# Patient Record
Sex: Male | Born: 1945 | Race: White | Hispanic: No | Smoking: Never smoker
Health system: Southern US, Community
[De-identification: ages and names within clinical notes are randomized; demographics above are authoritative.]

## PROBLEM LIST (undated history)

## (undated) DIAGNOSIS — E785 Hyperlipidemia, unspecified: Secondary | ICD-10-CM

## (undated) DIAGNOSIS — I213 ST elevation (STEMI) myocardial infarction of unspecified site: Secondary | ICD-10-CM

## (undated) DIAGNOSIS — Z9289 Personal history of other medical treatment: Secondary | ICD-10-CM

## (undated) DIAGNOSIS — I2510 Atherosclerotic heart disease of native coronary artery without angina pectoris: Secondary | ICD-10-CM

## (undated) DIAGNOSIS — N200 Calculus of kidney: Secondary | ICD-10-CM

## (undated) DIAGNOSIS — Z8249 Family history of ischemic heart disease and other diseases of the circulatory system: Secondary | ICD-10-CM

## (undated) DIAGNOSIS — E039 Hypothyroidism, unspecified: Secondary | ICD-10-CM

## (undated) HISTORY — DX: Hyperlipidemia, unspecified: E78.5

## (undated) HISTORY — DX: Personal history of other medical treatment: Z92.89

## (undated) HISTORY — PX: NASAL SEPTUM SURGERY: SHX37

## (undated) HISTORY — DX: ST elevation (STEMI) myocardial infarction of unspecified site: I21.3

## (undated) HISTORY — DX: Family history of ischemic heart disease and other diseases of the circulatory system: Z82.49

## (undated) HISTORY — DX: Atherosclerotic heart disease of native coronary artery without angina pectoris: I25.10

---

## 2005-01-23 ENCOUNTER — Ambulatory Visit (HOSPITAL_COMMUNITY): Admission: RE | Admit: 2005-01-23 | Discharge: 2005-01-23 | Payer: Self-pay | Admitting: Gastroenterology

## 2005-01-26 ENCOUNTER — Ambulatory Visit (HOSPITAL_COMMUNITY): Admission: RE | Admit: 2005-01-26 | Discharge: 2005-01-26 | Payer: Self-pay | Admitting: Gastroenterology

## 2005-03-20 ENCOUNTER — Encounter: Admission: RE | Admit: 2005-03-20 | Discharge: 2005-03-20 | Payer: Self-pay | Admitting: Emergency Medicine

## 2010-11-27 ENCOUNTER — Encounter: Payer: Self-pay | Admitting: Gastroenterology

## 2011-01-20 ENCOUNTER — Telehealth (INDEPENDENT_AMBULATORY_CARE_PROVIDER_SITE_OTHER): Payer: Self-pay | Admitting: *Deleted

## 2011-01-23 ENCOUNTER — Encounter: Payer: Self-pay | Admitting: Cardiology

## 2011-01-23 ENCOUNTER — Ambulatory Visit (HOSPITAL_COMMUNITY): Payer: BC Managed Care – PPO | Attending: Family Medicine

## 2011-01-23 DIAGNOSIS — R079 Chest pain, unspecified: Secondary | ICD-10-CM

## 2011-01-23 DIAGNOSIS — Z136 Encounter for screening for cardiovascular disorders: Secondary | ICD-10-CM | POA: Insufficient documentation

## 2011-01-24 NOTE — Progress Notes (Signed)
Summary: Nuclear Pre-Procedure  Phone Note Outgoing Call Call back at [facility] Phone [phone]   Call placed by: Stanton Kidney, EMT-P,  January 20, 2011 10:47 AM Call placed to: Patient Action Taken: Phone Call Completed Summary of Call: Reviewed information on Myoview Information Sheet (see scanned document for further details).  Spoke with the patient. Stanton Kidney, EMT-P  January 20, 2011 10:48 AM      Nuclear Med Background Indications for Stress Test: Evaluation for Ischemia   History: GXT  History Comments: '97 GXT: NL  Symptoms: Chest Pain    Nuclear Pre-Procedure Cardiac Risk Factors: Family History - CAD, Hypertension, Lipids

## 2011-01-27 ENCOUNTER — Telehealth: Payer: Self-pay | Admitting: Cardiology

## 2011-01-27 NOTE — Telephone Encounter (Signed)
Faxed Stress to Patty at [facility] ([phone]).

## 2011-01-31 ENCOUNTER — Telehealth: Payer: Self-pay | Admitting: Cardiology

## 2011-01-31 NOTE — Telephone Encounter (Signed)
Stress faxed to [facility] @ [facility] @ [phone] 01/31/11/KM

## 2011-02-02 NOTE — Assessment & Plan Note (Signed)
Summary: Cardiology Nuclear Testing  Nuclear Med Background Indications for Stress Test: Evaluation for Ischemia   History: GXT  History Comments: '97 GXT: NL  Symptoms: Chest Pain, Chest Tightness    Nuclear Pre-Procedure Cardiac Risk Factors: Family History - CAD, Hypertension, Lipids Caffeine/Decaff Intake: none NPO After: 9:00 AM Lungs: clear IV 0.9% NS with Angio Cath: 18g     IV Site: R Antecubital IV Started by: Stanton Kidney, EMT-P Chest Size (in) 42     Height (in): 69.5 Weight (lb): 194 BMI: 28.34  Nuclear Med Study 1 or 2 day study:  1 day     Stress Test Type:  Stress Reading MD:  Olga Millers, MD     Referring MD:  Ancil Boozer Resting Radionuclide:  Technetium 33m Tetrofosmin     Resting Radionuclide Dose:  11 mCi  Stress Radionuclide:  Technetium 35m Tetrofosmin     Stress Radionuclide Dose:  33 mCi   Stress Protocol Exercise Time (min):  9:01 min     Max HR:  137 bpm     Predicted Max HR:  157 bpm  Max Systolic BP: 211 mm Hg     Percent Max HR:  87.26 %     METS: 10.10 Rate Pressure Product:  24097    Stress Test Technologist:  Milana Na, EMT-P     Nuclear Technologist:  Domenic Polite, CNMT  Rest Procedure  Myocardial perfusion imaging was performed at rest 45 minutes following the intravenous administration of Technetium 84m Tetrofosmin.  Stress Procedure  The patient exercised for 9:01. The patient stopped due to fatigue and chest tightness.  There were non specific ST-T wave changes.  Technetium 42m Tetrofosmin was injected at peak exercise and myocardial perfusion imaging was performed after a brief delay.  QPS Raw Data Images:  Acquisition technically good; normal left ventricular size. Stress Images:  Normal homogeneous uptake in all areas of the myocardium. Rest Images:  Normal homogeneous uptake in all areas of the myocardium. Subtraction (SDS):  No evidence of ischemia. Transient Ischemic Dilatation:  1.02  (Normal <1.22)  Lung/Heart Ratio:  .24  (Normal <0.45)  Quantitative Gated Spect Images QGS EDV:  111 ml QGS ESV:  45 ml QGS EF:  60 % QGS cine images:  Normal wall motion.   Overall Impression  Exercise Capacity: Good exercise capacity. BP Response: Hypertensive blood pressure response. Clinical Symptoms: There is chest pain ECG Impression: No diagnostic ST segment change suggestive of ischemia. Overall Impression: There is no sign of scar or ischemia; note patient did have chest pain during exercise.

## 2011-02-20 ENCOUNTER — Telehealth: Payer: Self-pay | Admitting: Cardiology

## 2011-02-20 NOTE — Telephone Encounter (Signed)
Faxed Stress over to [facility] to [phone] 02/20/11/KM

## 2011-04-30 ENCOUNTER — Emergency Department (HOSPITAL_COMMUNITY): Payer: BC Managed Care – PPO

## 2011-04-30 ENCOUNTER — Inpatient Hospital Stay (HOSPITAL_COMMUNITY)
Admission: EM | Admit: 2011-04-30 | Discharge: 2011-05-04 | DRG: 808 | Disposition: A | Discharge status: Home / Self Care | Payer: BC Managed Care – PPO | Attending: Cardiovascular Disease | Admitting: Cardiovascular Disease

## 2011-04-30 DIAGNOSIS — Z7901 Long term (current) use of anticoagulants: Secondary | ICD-10-CM

## 2011-04-30 DIAGNOSIS — I10 Essential (primary) hypertension: Secondary | ICD-10-CM | POA: Diagnosis present

## 2011-04-30 DIAGNOSIS — Z7982 Long term (current) use of aspirin: Secondary | ICD-10-CM

## 2011-04-30 DIAGNOSIS — I2582 Chronic total occlusion of coronary artery: Secondary | ICD-10-CM | POA: Diagnosis present

## 2011-04-30 DIAGNOSIS — M549 Dorsalgia, unspecified: Secondary | ICD-10-CM | POA: Diagnosis present

## 2011-04-30 DIAGNOSIS — D696 Thrombocytopenia, unspecified: Secondary | ICD-10-CM | POA: Diagnosis present

## 2011-04-30 DIAGNOSIS — I2510 Atherosclerotic heart disease of native coronary artery without angina pectoris: Secondary | ICD-10-CM | POA: Diagnosis present

## 2011-04-30 DIAGNOSIS — I213 ST elevation (STEMI) myocardial infarction of unspecified site: Secondary | ICD-10-CM

## 2011-04-30 DIAGNOSIS — I2109 ST elevation (STEMI) myocardial infarction involving other coronary artery of anterior wall: Principal | ICD-10-CM | POA: Diagnosis present

## 2011-04-30 DIAGNOSIS — I2589 Other forms of chronic ischemic heart disease: Secondary | ICD-10-CM | POA: Diagnosis present

## 2011-04-30 DIAGNOSIS — E785 Hyperlipidemia, unspecified: Secondary | ICD-10-CM | POA: Diagnosis present

## 2011-04-30 DIAGNOSIS — E039 Hypothyroidism, unspecified: Secondary | ICD-10-CM | POA: Diagnosis present

## 2011-04-30 DIAGNOSIS — K219 Gastro-esophageal reflux disease without esophagitis: Secondary | ICD-10-CM | POA: Diagnosis present

## 2011-04-30 HISTORY — PX: CORONARY ANGIOPLASTY WITH STENT PLACEMENT: SHX49

## 2011-04-30 HISTORY — DX: ST elevation (STEMI) myocardial infarction of unspecified site: I21.3

## 2011-04-30 LAB — CK TOTAL AND CKMB (NOT AT ARMC): Relative Index: 1.5 (ref 0.0–2.5)

## 2011-04-30 LAB — COMPREHENSIVE METABOLIC PANEL
ALT: 19 U/L (ref 0–53)
Albumin: 3.8 g/dL (ref 3.5–5.2)
CO2: 19 mEq/L (ref 19–32)
Calcium: 9.6 mg/dL (ref 8.4–10.5)
Creatinine, Ser: 1.21 mg/dL (ref 0.50–1.35)
GFR calc Af Amer: >60 mL/min (ref >60)
Glucose, Bld: 159 mg/dL — ABNORMAL HIGH (ref 70–99)
Total Protein: 6.5 g/dL (ref 6.0–8.3)

## 2011-04-30 LAB — APTT: aPTT: 20 seconds — ABNORMAL LOW (ref 24–37)

## 2011-05-01 LAB — LIPID PANEL
Cholesterol: 147 mg/dL (ref 0–200)
LDL Cholesterol: 86 mg/dL (ref 0–99)

## 2011-05-01 LAB — CARDIAC PANEL(CRET KIN+CKTOT+MB+TROPI)
CK, MB: 70.6 ng/mL (ref 0.3–4.0)
Relative Index: 9.5 — ABNORMAL HIGH (ref 0.0–2.5)
Relative Index: 9 — ABNORMAL HIGH (ref 0.0–2.5)
Total CK: 788 U/L — ABNORMAL HIGH (ref 7–232)
Total CK: 785 U/L — ABNORMAL HIGH (ref 7–232)
Troponin I: 21.36 ng/mL (ref <0.30)

## 2011-05-01 LAB — CBC
HCT: 39.8 % (ref 39.0–52.0)
Hemoglobin: 14.1 g/dL (ref 13.0–17.0)
MCH: 31.6 pg (ref 26.0–34.0)
MCHC: 35.8 g/dL (ref 30.0–36.0)
MCV: 89.2 fL (ref 78.0–100.0)
MCV: 89.7 fL (ref 78.0–100.0)
Platelets: 135 10*3/uL — ABNORMAL LOW (ref 150–400)
Platelets: 145 10*3/uL — ABNORMAL LOW (ref 150–400)
RBC: 4.46 MIL/uL (ref 4.22–5.81)
RBC: 4.55 MIL/uL (ref 4.22–5.81)

## 2011-05-01 LAB — BASIC METABOLIC PANEL
BUN: 16 mg/dL (ref 6–23)
GFR calc non Af Amer: >60 mL/min (ref >60)
Glucose, Bld: 89 mg/dL (ref 70–99)

## 2011-05-01 LAB — POCT ACTIVATED CLOTTING TIME
Activated Clotting Time: 166 seconds
Activated Clotting Time: 424 seconds

## 2011-05-01 LAB — POCT I-STAT, CHEM 8
Calcium, Ion: 1.16 mmol/L (ref 1.12–1.32)
Glucose, Bld: 169 mg/dL — ABNORMAL HIGH (ref 70–99)
TCO2: 20 mmol/L (ref 0–100)

## 2011-05-02 LAB — BASIC METABOLIC PANEL
BUN: 14 mg/dL (ref 6–23)
CO2: 26 mEq/L (ref 19–32)
Chloride: 105 mEq/L (ref 96–112)
GFR calc non Af Amer: >60 mL/min (ref >60)
Potassium: 3.7 mEq/L (ref 3.5–5.1)
Sodium: 138 mEq/L (ref 135–145)

## 2011-05-02 LAB — CBC
Hemoglobin: 14 g/dL (ref 13.0–17.0)
MCH: 31.7 pg (ref 26.0–34.0)
MCHC: 35.5 g/dL (ref 30.0–36.0)
Platelets: 118 10*3/uL — ABNORMAL LOW (ref 150–400)
RDW: 13.4 % (ref 11.5–15.5)
WBC: 9.2 10*3/uL (ref 4.0–10.5)

## 2011-05-02 NOTE — Cardiovascular Report (Signed)
NAMEMCKENZIE, TORUNO NO.:  [id number]  MEDICAL RECORD NO.:  [id number]  LOCATION:  2911                         FACILITY:  [facility]  PHYSICIAN:  Nanetta Batty, M.D.   DATE OF BIRTH:  [birth year]  DATE OF PROCEDURE: DATE OF DISCHARGE:                           CARDIAC CATHETERIZATION   Devin Norris is a 65 year old divorced Caucasian male with no cardiac risk factors other than strong family history.  He has been having some atypical chest pain.  Recently had a negative stress test at [facility] approximately a month ago.  He was playing tennis today and developed crushing chest pain at approximately 2 o'clock.  EMS was called and EKG showed acute anterior wall ST-segment elevation myocardial infarction.  He was brought emergently to [facility] for cath and intervention.  DESCRIPTION OF PROCEDURE:  The patient was brought to the Second Floor [facility] Cardiac Cath Lab urgently.  He was treated with IV heparin, morphine, and aspirin.  His right groin was prepped and shaved in the usual sterile fashion.  Xylocaine 1% was used for local anesthesia.  A 6- French sheath was inserted to the right femoral artery using standard Seldinger technique.  A 6-French right and left Judkins diagnostic catheter as well as a 6-French pigtail catheter were used for selective coronary angiography and left ventriculography respectively which was done at the end of the case.  Visipaque dye was used for the entirety of the case.  Retrograde aortic, ventricular, and pullback pressures were recorded.  HEMODYNAMICS: 1. Aortic systolic pressure 137, diastolic pressure is 75. 2. Left ventricular systolic pressure 133, end-diastolic pressure 29.  SELECTIVE CORONARY ANGIOGRAPHY: 1. Left main normal. 2. LAD; LAD was a large vessel and was occluded proximally. 3. Left circumflex; this was nondominant and was free of significant     disease. 4. Right coronary artery; this was dominant  and free of significant     disease. 5. Left ventriculography; RAO left ventriculogram was performed using     25 mL of Visipaque dye at 12 mL per second.  The overall LVEF was     estimated at approximately 30% with anteroapical akinesia.  The patient received Effient 60 mg p.o., Angiomax bolus with an ACT of 424 with the total contrast for the patient at 195 mL.  Using a 6-French XB LAD 3.5 guide catheter along with an 0.014 x 190 Asahi soft wire and a 2.0 x 12 Emerge angioplasty balloon reperfusion was obtained.  The patient did have significant reperfusion arrhythmias and he was given 300 mg of amiodarone bolus in the cath lab.  This ultimately became quiescent.  Following this, a 3.5 x 18 Vision bare metal stent was then deployed at 14 atmospheres and postdilated with a 4.0 x 15 Hudson Quantum at 14 atmospheres (4.04 mm).  Resulting reduction of total occlusion to 0% residual with TIMI 3 flow.  The patient tolerated the procedure well. The guidewire and catheter were removed.  The sheath was sewn securely in place.  The patient left the lab in stable condition.  IMPRESSION:  Successful direct angioplasty in the setting of an anterior wall STEMI with __________ balloon time of 27 minutes.  The patient will  be treated with aspirin, Effient, beta-blocker, ACE inhibitor, statin drug.  His enzymes will be cycled.  He will be discharged home in 72 hours.  Left lab in stable condition.     Nanetta Batty, M.D.     Cordelia Pen  D:  04/30/2011  T:  05/01/2011  Job:  [id number]  cc:   Second Floor [facility] Cardiac Cath Lab [facility]  Electronically Signed by Nanetta Batty M.D. on 05/02/2011 09:25:05 AM

## 2011-05-03 LAB — CBC
HCT: 44.5 % (ref 39.0–52.0)
Hemoglobin: 16 g/dL (ref 13.0–17.0)
MCHC: 36 g/dL (ref 30.0–36.0)
MCV: 89.4 fL (ref 78.0–100.0)
RDW: 13.4 % (ref 11.5–15.5)

## 2011-05-03 LAB — BASIC METABOLIC PANEL
BUN: 16 mg/dL (ref 6–23)
Chloride: 101 mEq/L (ref 96–112)
Creatinine, Ser: 1.04 mg/dL (ref 0.50–1.35)
GFR calc Af Amer: >60 mL/min (ref >60)
GFR calc non Af Amer: >60 mL/min (ref >60)
Glucose, Bld: 84 mg/dL (ref 70–99)
Potassium: 4.6 mEq/L (ref 3.5–5.1)

## 2011-05-03 LAB — MAGNESIUM: Magnesium: 1.9 mg/dL (ref 1.5–2.5)

## 2011-05-03 LAB — PHOSPHORUS: Phosphorus: 3.1 mg/dL (ref 2.3–4.6)

## 2011-05-03 LAB — POTASSIUM: Potassium: 3.7 mEq/L (ref 3.5–5.1)

## 2011-05-04 LAB — BASIC METABOLIC PANEL
BUN: 15 mg/dL (ref 6–23)
CO2: 26 mEq/L (ref 19–32)
Calcium: 8.3 mg/dL — ABNORMAL LOW (ref 8.4–10.5)
Chloride: 103 mEq/L (ref 96–112)
Creatinine, Ser: 0.96 mg/dL (ref 0.50–1.35)

## 2011-05-04 LAB — CBC
HCT: 44.2 % (ref 39.0–52.0)
Hemoglobin: 15.9 g/dL (ref 13.0–17.0)
MCH: 31.9 pg (ref 26.0–34.0)
MCHC: 36 g/dL (ref 30.0–36.0)
RDW: 13.4 % (ref 11.5–15.5)

## 2011-05-31 NOTE — Discharge Summary (Signed)
NAMERANVIR, RENOVATO NO.:  [id number]  MEDICAL RECORD NO.:  [id number]  LOCATION:  2030                         FACILITY:  [facility]  PHYSICIAN:  Nanetta Batty, M.D.   DATE OF BIRTH:  09-01-1946  DATE OF ADMISSION:  04/30/2011 DATE OF DISCHARGE:  05/04/2011                              DISCHARGE SUMMARY   DISCHARGE DIAGNOSES: 1. ST-elevation myocardial infarction of the anterior wall with 100%     left anterior descending stenosis.     a.     Emergent cardiac catheterization with rescue percutaneous      transluminal coronary angioplasty and bare-metal, Multilink Vision      stent to the left anterior descending with excellent results. 2. Initial ischemic cardiomyopathy with ejection fraction 30%, but by     2-D echo, January 31, 2011, ejection fraction 40%, and by Definity     contrast echo on June 28, ejection fraction up to 50%.     a.     No clot visualized. 3. Hypertension, treated and controlled. 4. Dyslipidemia. 5. Back pain secondary to mechanical issues during hospitalization. 6. Hypothyroidism. 7. Thrombocytopenia.  DISCHARGE CONDITION:  Improved.  PROCEDURES: 1. Emergent cardiac catheterization, April 30, 2011, by Dr. Allyson Sabal. 2. PTCA and stent deployment with a Multilink Vision bare-metal stent     by Dr. Nanetta Batty to the LAD, reducing 100% stenosis to 0 and     EF at that time was 30%.  DISCHARGE MEDICATIONS:  See medication reconciliation sheet from [facility], though we did add Effient for his stent as well as aspirin, Altace, and Crestor.  DISCHARGE INSTRUCTIONS: 1. Return to work after you see Dr. Allyson Sabal, planned to be out for 2     weeks. 2. Increase activity slowly.  May shower.  No lifting for 2 weeks.  No     driving for 1 week. 3. Low-sodium, heart-healthy diet. 4. Wash cath site with soap and water.  Call if any bleeding,     swelling, or drainage. 5. Follow up with Dr. Allyson Sabal, May 19, 2011, at 10:00 a.m.  HOSPITAL COURSE:  A  65 year old white male with a recent stress test 2 months ago for chest pain which was normal, was diagnosed with gastroesophageal reflux disease.  The patient has a history of hypertension.  He was playing tennis on April 30, 2011, when he developed 10/10 chest pain and diaphoresis and EMS was called.  His EKG revealed ST elevation in leads V1, II, and III and reciprocal changes in V5, V3, II, III, and aVF.  He was taken emergently to the cath lab for ST- elevation MI and was found to have a 100% LAD stenosis.  No other coronary artery disease was noted.  He underwent PTCA and stent deployment as stated.  LV function at that time was 30%.  The patient was admitted to the ICU/CCU and was stable.  His troponin peaked at 22. He did develop some back pain due to external pads and wires on that were on his back which resolved before discharge.  The patient was stabilized, blood pressure remained stable, continued to be improved during hospitalization.  He ambulated with cardiac rehab.  The patient also  has a history of hypothyroidism.  On June 27, 2-D echo was done which revealed improvement of EF up to 40%, but there is a question of small thrombus in apical anterolateral wall.  He was started on Coumadin and Lovenox and scheduled for Definity contrast echo on June 28.  The Definity contrast echo revealed no clots, EF was up to 50%, and he was stable for discharge home.  PHYSICAL EXAMINATION:  VITAL SIGNS:  Blood pressure at discharge 128/78, pulse 66, respiratory rate is 18, temperature 97.6, and oxygen saturation 99% on room air. HEART:  Regular rate and rhythm.  S1 and S2. LUNGS:  Clear. ABDOMEN:  Soft and nontender.  Positive bowel sounds. EXTREMITIES:  No hematoma at site.  He had talked with cardiac rehab as well and had no questions at time of discharge.  LABORATORY DATA:  Hemoglobin 15.9, hematocrit 44.2, WBCs were 9.2, and platelets 126,000.  They did drop with a combination  of Lovenox added to his medical regimen, and they had been 125,000 on admission.  Sodium 138, potassium 4, chloride 103, CO2 of 26, glucose 88, BUN 15, and creatinine 0.96.  Total bili 0.6, alkaline phos 66, SGOT 33, SGPT 19, total protein 6.5, albumin 3.8, calcium 9.6, phosphorus 3.1, and magnesium 1.9.  Cardiac enzymes; CK ranged 161, 788, 785; MBs 2.4, 75.2, 70.6; and troponin less than 0.30, 22.16, and 21.36.  Cholesterol 147, triglycerides 80, HDL 45, and LDL 86.  MRSA screen was negative.  Chest x-ray on admission low-volume film with cardiomegaly and pulmonary vascular congestion without any overt pulmonary edema.  As stated, the patient ambulated with cardiac rehab prior to discharge without any complications.  He felt well at discharge and had no complaints.  He will follow up as instructed.     Darcella Gasman. Annie Paras, N.P.   ______________________________ Nanetta Batty, M.D.    LRI/MEDQ  D:  05/04/2011  T:  05/05/2011  Job:  [id number]  cc:   Ancil Boozer, MD  Electronically Signed by Nada Boozer N.P. on 05/11/2011 02:48:46 PM Electronically Signed by Nanetta Batty M.D. on 05/31/2011 03:17:33 PM

## 2011-06-19 ENCOUNTER — Encounter (HOSPITAL_COMMUNITY)
Admission: RE | Admit: 2011-06-19 | Discharge: 2011-06-19 | Disposition: A | Discharge status: Home / Self Care | Payer: BC Managed Care – PPO | Source: Ambulatory Visit | Attending: Cardiovascular Disease | Admitting: Cardiovascular Disease

## 2011-06-19 DIAGNOSIS — I2589 Other forms of chronic ischemic heart disease: Secondary | ICD-10-CM | POA: Insufficient documentation

## 2011-06-19 DIAGNOSIS — E785 Hyperlipidemia, unspecified: Secondary | ICD-10-CM | POA: Insufficient documentation

## 2011-06-19 DIAGNOSIS — Z9861 Coronary angioplasty status: Secondary | ICD-10-CM | POA: Insufficient documentation

## 2011-06-19 DIAGNOSIS — Z5189 Encounter for other specified aftercare: Secondary | ICD-10-CM | POA: Insufficient documentation

## 2011-06-19 DIAGNOSIS — I10 Essential (primary) hypertension: Secondary | ICD-10-CM | POA: Insufficient documentation

## 2011-06-19 DIAGNOSIS — I252 Old myocardial infarction: Secondary | ICD-10-CM | POA: Insufficient documentation

## 2011-06-19 DIAGNOSIS — D696 Thrombocytopenia, unspecified: Secondary | ICD-10-CM | POA: Insufficient documentation

## 2011-06-19 DIAGNOSIS — E039 Hypothyroidism, unspecified: Secondary | ICD-10-CM | POA: Insufficient documentation

## 2011-06-21 ENCOUNTER — Encounter (HOSPITAL_COMMUNITY): Payer: BC Managed Care – PPO

## 2011-06-23 ENCOUNTER — Encounter (HOSPITAL_COMMUNITY): Payer: BC Managed Care – PPO

## 2011-06-26 ENCOUNTER — Encounter (HOSPITAL_COMMUNITY): Payer: BC Managed Care – PPO

## 2011-06-28 ENCOUNTER — Encounter (HOSPITAL_COMMUNITY): Payer: BC Managed Care – PPO

## 2011-06-30 ENCOUNTER — Encounter (HOSPITAL_COMMUNITY): Payer: BC Managed Care – PPO

## 2011-07-03 ENCOUNTER — Encounter (HOSPITAL_COMMUNITY): Payer: BC Managed Care – PPO

## 2011-07-05 ENCOUNTER — Encounter (HOSPITAL_COMMUNITY): Payer: BC Managed Care – PPO

## 2011-07-07 ENCOUNTER — Encounter (HOSPITAL_COMMUNITY): Payer: BC Managed Care – PPO

## 2011-07-10 ENCOUNTER — Encounter (HOSPITAL_COMMUNITY): Payer: BC Managed Care – PPO

## 2011-07-12 ENCOUNTER — Encounter (HOSPITAL_COMMUNITY): Payer: BC Managed Care – PPO | Attending: Cardiovascular Disease

## 2011-07-12 DIAGNOSIS — E039 Hypothyroidism, unspecified: Secondary | ICD-10-CM | POA: Insufficient documentation

## 2011-07-12 DIAGNOSIS — Z9861 Coronary angioplasty status: Secondary | ICD-10-CM | POA: Insufficient documentation

## 2011-07-12 DIAGNOSIS — I2589 Other forms of chronic ischemic heart disease: Secondary | ICD-10-CM | POA: Insufficient documentation

## 2011-07-12 DIAGNOSIS — E785 Hyperlipidemia, unspecified: Secondary | ICD-10-CM | POA: Insufficient documentation

## 2011-07-12 DIAGNOSIS — I10 Essential (primary) hypertension: Secondary | ICD-10-CM | POA: Insufficient documentation

## 2011-07-12 DIAGNOSIS — Z5189 Encounter for other specified aftercare: Secondary | ICD-10-CM | POA: Insufficient documentation

## 2011-07-12 DIAGNOSIS — I252 Old myocardial infarction: Secondary | ICD-10-CM | POA: Insufficient documentation

## 2011-07-12 DIAGNOSIS — D696 Thrombocytopenia, unspecified: Secondary | ICD-10-CM | POA: Insufficient documentation

## 2011-07-14 ENCOUNTER — Encounter (HOSPITAL_COMMUNITY): Payer: BC Managed Care – PPO

## 2011-07-17 ENCOUNTER — Encounter (HOSPITAL_COMMUNITY): Payer: BC Managed Care – PPO

## 2011-07-19 ENCOUNTER — Encounter (HOSPITAL_COMMUNITY): Payer: BC Managed Care – PPO

## 2011-07-21 ENCOUNTER — Encounter (HOSPITAL_COMMUNITY): Payer: BC Managed Care – PPO

## 2011-07-24 ENCOUNTER — Encounter (HOSPITAL_COMMUNITY): Payer: BC Managed Care – PPO

## 2011-07-26 ENCOUNTER — Encounter (HOSPITAL_COMMUNITY): Payer: BC Managed Care – PPO

## 2011-07-28 ENCOUNTER — Encounter (HOSPITAL_COMMUNITY): Payer: BC Managed Care – PPO

## 2011-07-28 DIAGNOSIS — Z9289 Personal history of other medical treatment: Secondary | ICD-10-CM

## 2011-07-28 HISTORY — PX: TRANSTHORACIC ECHOCARDIOGRAM: SHX275

## 2011-07-28 HISTORY — DX: Personal history of other medical treatment: Z92.89

## 2011-07-31 ENCOUNTER — Encounter (HOSPITAL_COMMUNITY): Payer: BC Managed Care – PPO

## 2011-08-02 ENCOUNTER — Encounter (HOSPITAL_COMMUNITY): Payer: BC Managed Care – PPO

## 2011-08-04 ENCOUNTER — Encounter (HOSPITAL_COMMUNITY): Payer: BC Managed Care – PPO

## 2011-08-07 ENCOUNTER — Encounter (HOSPITAL_COMMUNITY): Payer: BC Managed Care – PPO

## 2011-08-09 ENCOUNTER — Encounter (HOSPITAL_COMMUNITY): Payer: BC Managed Care – PPO | Attending: Cardiovascular Disease

## 2011-08-09 DIAGNOSIS — Z9861 Coronary angioplasty status: Secondary | ICD-10-CM | POA: Insufficient documentation

## 2011-08-09 DIAGNOSIS — D696 Thrombocytopenia, unspecified: Secondary | ICD-10-CM | POA: Insufficient documentation

## 2011-08-09 DIAGNOSIS — Z5189 Encounter for other specified aftercare: Secondary | ICD-10-CM | POA: Insufficient documentation

## 2011-08-09 DIAGNOSIS — I252 Old myocardial infarction: Secondary | ICD-10-CM | POA: Insufficient documentation

## 2011-08-09 DIAGNOSIS — I2589 Other forms of chronic ischemic heart disease: Secondary | ICD-10-CM | POA: Insufficient documentation

## 2011-08-09 DIAGNOSIS — E039 Hypothyroidism, unspecified: Secondary | ICD-10-CM | POA: Insufficient documentation

## 2011-08-09 DIAGNOSIS — E785 Hyperlipidemia, unspecified: Secondary | ICD-10-CM | POA: Insufficient documentation

## 2011-08-09 DIAGNOSIS — I10 Essential (primary) hypertension: Secondary | ICD-10-CM | POA: Insufficient documentation

## 2011-08-11 ENCOUNTER — Encounter (HOSPITAL_COMMUNITY): Payer: BC Managed Care – PPO

## 2011-08-14 ENCOUNTER — Encounter (HOSPITAL_COMMUNITY): Payer: BC Managed Care – PPO

## 2011-08-16 ENCOUNTER — Encounter (HOSPITAL_COMMUNITY): Payer: BC Managed Care – PPO

## 2011-08-18 ENCOUNTER — Encounter (HOSPITAL_COMMUNITY): Payer: BC Managed Care – PPO

## 2011-08-21 ENCOUNTER — Encounter (HOSPITAL_COMMUNITY): Payer: BC Managed Care – PPO

## 2011-08-23 ENCOUNTER — Encounter (HOSPITAL_COMMUNITY): Payer: BC Managed Care – PPO

## 2011-08-25 ENCOUNTER — Encounter (HOSPITAL_COMMUNITY): Payer: BC Managed Care – PPO

## 2011-08-28 ENCOUNTER — Encounter (HOSPITAL_COMMUNITY): Payer: BC Managed Care – PPO

## 2011-08-30 ENCOUNTER — Other Ambulatory Visit: Payer: Self-pay | Admitting: Cardiovascular Disease

## 2011-08-30 ENCOUNTER — Encounter (HOSPITAL_COMMUNITY): Payer: BC Managed Care – PPO

## 2011-09-01 ENCOUNTER — Encounter (HOSPITAL_COMMUNITY): Payer: BC Managed Care – PPO

## 2011-09-04 ENCOUNTER — Encounter (HOSPITAL_COMMUNITY): Payer: BC Managed Care – PPO

## 2011-09-06 ENCOUNTER — Encounter (HOSPITAL_COMMUNITY): Payer: BC Managed Care – PPO

## 2011-09-08 ENCOUNTER — Encounter (HOSPITAL_COMMUNITY): Payer: BC Managed Care – PPO

## 2011-09-08 DIAGNOSIS — I2589 Other forms of chronic ischemic heart disease: Secondary | ICD-10-CM | POA: Insufficient documentation

## 2011-09-08 DIAGNOSIS — I10 Essential (primary) hypertension: Secondary | ICD-10-CM | POA: Insufficient documentation

## 2011-09-08 DIAGNOSIS — Z5189 Encounter for other specified aftercare: Secondary | ICD-10-CM | POA: Insufficient documentation

## 2011-09-08 DIAGNOSIS — E039 Hypothyroidism, unspecified: Secondary | ICD-10-CM | POA: Insufficient documentation

## 2011-09-08 DIAGNOSIS — D696 Thrombocytopenia, unspecified: Secondary | ICD-10-CM | POA: Insufficient documentation

## 2011-09-08 DIAGNOSIS — E785 Hyperlipidemia, unspecified: Secondary | ICD-10-CM | POA: Insufficient documentation

## 2011-09-08 DIAGNOSIS — Z9861 Coronary angioplasty status: Secondary | ICD-10-CM | POA: Insufficient documentation

## 2011-09-08 DIAGNOSIS — I252 Old myocardial infarction: Secondary | ICD-10-CM | POA: Insufficient documentation

## 2011-09-11 ENCOUNTER — Encounter (HOSPITAL_COMMUNITY): Payer: BC Managed Care – PPO

## 2011-09-13 ENCOUNTER — Encounter (HOSPITAL_COMMUNITY): Payer: BC Managed Care – PPO

## 2011-09-15 ENCOUNTER — Encounter (HOSPITAL_COMMUNITY): Payer: BC Managed Care – PPO

## 2011-09-15 NOTE — Progress Notes (Signed)
Devin Norris's blood pressure went up to 180/78 today at cardiac rehab today on the nustep.  Pt switched to the track bp then 172/80 exit bp 134/70. Devin Norris took his medications as prescribed.  Will fax bp's to Dr Hazle Coca office for review. Pt denied complaints at cardiac rehab.

## 2011-09-18 ENCOUNTER — Encounter (HOSPITAL_COMMUNITY): Payer: BC Managed Care – PPO

## 2011-09-20 ENCOUNTER — Encounter (HOSPITAL_COMMUNITY): Payer: BC Managed Care – PPO

## 2011-09-22 ENCOUNTER — Encounter (HOSPITAL_COMMUNITY)
Admission: RE | Admit: 2011-09-22 | Discharge: 2011-09-22 | Disposition: A | Discharge status: Home / Self Care | Payer: BC Managed Care – PPO | Source: Ambulatory Visit | Attending: Cardiovascular Disease | Admitting: Cardiovascular Disease

## 2011-09-25 ENCOUNTER — Encounter (HOSPITAL_COMMUNITY)
Admission: RE | Admit: 2011-09-25 | Discharge: 2011-09-25 | Disposition: A | Discharge status: Home / Self Care | Payer: BC Managed Care – PPO | Source: Ambulatory Visit | Attending: Cardiovascular Disease | Admitting: Cardiovascular Disease

## 2011-09-27 ENCOUNTER — Encounter (HOSPITAL_COMMUNITY)
Admission: RE | Admit: 2011-09-27 | Discharge: 2011-09-27 | Disposition: A | Discharge status: Home / Self Care | Payer: BC Managed Care – PPO | Source: Ambulatory Visit | Attending: Cardiovascular Disease | Admitting: Cardiovascular Disease

## 2011-10-02 ENCOUNTER — Encounter (HOSPITAL_COMMUNITY)
Admission: RE | Admit: 2011-10-02 | Discharge: 2011-10-02 | Disposition: A | Discharge status: Home / Self Care | Payer: BC Managed Care – PPO | Source: Ambulatory Visit | Attending: Cardiovascular Disease | Admitting: Cardiovascular Disease

## 2011-10-04 ENCOUNTER — Encounter (HOSPITAL_COMMUNITY): Payer: BC Managed Care – PPO

## 2011-10-06 ENCOUNTER — Encounter (HOSPITAL_COMMUNITY)
Admission: RE | Admit: 2011-10-06 | Discharge: 2011-10-06 | Disposition: A | Discharge status: Home / Self Care | Payer: BC Managed Care – PPO | Source: Ambulatory Visit | Attending: Cardiovascular Disease | Admitting: Cardiovascular Disease

## 2011-10-09 ENCOUNTER — Encounter (HOSPITAL_COMMUNITY)
Admission: RE | Admit: 2011-10-09 | Discharge: 2011-10-09 | Disposition: A | Discharge status: Home / Self Care | Payer: BC Managed Care – PPO | Source: Ambulatory Visit | Attending: Cardiovascular Disease | Admitting: Cardiovascular Disease

## 2011-10-09 DIAGNOSIS — I2589 Other forms of chronic ischemic heart disease: Secondary | ICD-10-CM | POA: Insufficient documentation

## 2011-10-09 DIAGNOSIS — Z9861 Coronary angioplasty status: Secondary | ICD-10-CM | POA: Insufficient documentation

## 2011-10-09 DIAGNOSIS — I10 Essential (primary) hypertension: Secondary | ICD-10-CM | POA: Insufficient documentation

## 2011-10-09 DIAGNOSIS — E785 Hyperlipidemia, unspecified: Secondary | ICD-10-CM | POA: Insufficient documentation

## 2011-10-09 DIAGNOSIS — D696 Thrombocytopenia, unspecified: Secondary | ICD-10-CM | POA: Insufficient documentation

## 2011-10-09 DIAGNOSIS — Z5189 Encounter for other specified aftercare: Secondary | ICD-10-CM | POA: Insufficient documentation

## 2011-10-09 DIAGNOSIS — I252 Old myocardial infarction: Secondary | ICD-10-CM | POA: Insufficient documentation

## 2011-10-09 DIAGNOSIS — E039 Hypothyroidism, unspecified: Secondary | ICD-10-CM | POA: Insufficient documentation

## 2011-10-11 ENCOUNTER — Encounter (HOSPITAL_COMMUNITY): Payer: BC Managed Care – PPO

## 2011-10-13 ENCOUNTER — Encounter (HOSPITAL_COMMUNITY): Payer: BC Managed Care – PPO

## 2011-10-13 NOTE — Progress Notes (Addendum)
Cardiac Rehabilitation Program Progress Report   Orientation:  06/15/2011 Graduate Date:  10/09/2011 Discharge Date:   # of sessions completed: 36/36  Cardiologist: Allyson Sabal Family MD:  Sandi Mealy Class Time:  945  A.  Exercise Program:  Tolerates exercise @ 5.8 METS for 30 minutes, Bike Test Results:  Pre: 1.27 miles and Post: 1.43 miles, Improved functional capacity  12.6 %, Improved  muscular strength  8.3 %, No Change  flexibility 12 inches %, Improved education score 4 %, Discharged to home exercise program.  Anticipated compliance:  good and Discharged  B.  Mental Health:  Good mental attitude and Quality of Life (QOL)  changes:  Overall  -6.4 %, Health/Functioning -2.5 %, Socioeconomics -7.8 %, Psych/Spiritual -1.2 %, Family -8.5 %    C.  Education/Instruction/Skills  Accurately checks own pulse.  Rest:  71  Exercise:  78-135, Knows THR for exercise, Uses Perceived Exertion Scale and/or Dyspnea Scale and Attended 11/13 education classes  Home exercise given: 06/23/2011  D.  Nutrition/Weight Control/Body Composition:  Adherence to prescribed nutrition program: good , % Body Fat  26.2 and Patient has lost 1.6 kg BMI 28.0  *This section completed by Mickle Plumb, Andres Shad, RD, LDN, CDE  E.  Blood Lipids    Lab Results  Component Value Date   CHOL 147 05/01/2011     Lab Results  Component Value Date   TRIG 80 05/01/2011     Lab Results  Component Value Date   HDL 45 05/01/2011     Lab Results  Component Value Date   CHOLHDL 3.3 05/01/2011     No results found for this basename: LDLDIRECT      F.  Lifestyle Changes:  Making positive lifestyle changes  G.  Symptoms noted with exercise:  Resting hypertension and Exertional hypertension  Report Completed By:  Hazle Nordmann   Comments:  Pt did well in program progressing from 3.4 METs to 5.8 METs.  Pt plans to continue to exercise by walking and playing tennis at home.  At discharge, pt  rhythm was sinus rhythm.       Thank you for the referral of fyour nice patient

## 2011-10-16 ENCOUNTER — Encounter (HOSPITAL_COMMUNITY): Payer: BC Managed Care – PPO

## 2011-10-18 ENCOUNTER — Encounter (HOSPITAL_COMMUNITY): Payer: BC Managed Care – PPO

## 2011-10-20 ENCOUNTER — Encounter (HOSPITAL_COMMUNITY): Payer: BC Managed Care – PPO

## 2013-05-02 ENCOUNTER — Other Ambulatory Visit: Payer: Self-pay | Admitting: *Deleted

## 2013-05-02 MED ORDER — ROSUVASTATIN CALCIUM 40 MG PO TABS: 40.0000 mg | ORAL_TABLET | Freq: Every day | ORAL | Status: DC

## 2013-06-25 ENCOUNTER — Telehealth: Payer: Self-pay | Admitting: Cardiovascular Disease

## 2013-06-25 NOTE — Telephone Encounter (Signed)
Need a lab order sent to him before his 75yr f/u appt on 08/04/2013.. Thanks

## 2013-06-25 NOTE — Telephone Encounter (Signed)
Lab slip mailed to pt. For cmp and lipids

## 2013-07-17 ENCOUNTER — Other Ambulatory Visit: Payer: Self-pay | Admitting: Cardiovascular Disease

## 2013-07-18 LAB — LIPID PANEL
Cholesterol: 131 mg/dL (ref 0–200)
HDL: 54 mg/dL (ref >39)
Total CHOL/HDL Ratio: 2.4 Ratio
Triglycerides: 62 mg/dL (ref <150)

## 2013-07-18 LAB — COMPREHENSIVE METABOLIC PANEL
BUN: 15 mg/dL (ref 6–23)
CO2: 29 mEq/L (ref 19–32)
Calcium: 9.3 mg/dL (ref 8.4–10.5)
Chloride: 105 mEq/L (ref 96–112)
Creat: 1.17 mg/dL (ref 0.50–1.35)
Glucose, Bld: 88 mg/dL (ref 70–99)
Total Bilirubin: 0.6 mg/dL (ref 0.3–1.2)

## 2013-07-28 ENCOUNTER — Encounter: Payer: Self-pay | Admitting: *Deleted

## 2013-08-01 ENCOUNTER — Encounter: Payer: Self-pay | Admitting: Cardiovascular Disease

## 2013-08-04 ENCOUNTER — Ambulatory Visit (INDEPENDENT_AMBULATORY_CARE_PROVIDER_SITE_OTHER): Payer: BC Managed Care – PPO | Admitting: Cardiovascular Disease

## 2013-08-04 ENCOUNTER — Encounter: Payer: Self-pay | Admitting: Cardiovascular Disease

## 2013-08-04 VITALS — BP 150/80 | HR 79 | Ht 69.0 in | Wt 193.0 lb

## 2013-08-04 DIAGNOSIS — E785 Hyperlipidemia, unspecified: Secondary | ICD-10-CM

## 2013-08-04 DIAGNOSIS — Z8249 Family history of ischemic heart disease and other diseases of the circulatory system: Secondary | ICD-10-CM

## 2013-08-04 DIAGNOSIS — I2510 Atherosclerotic heart disease of native coronary artery without angina pectoris: Secondary | ICD-10-CM

## 2013-08-04 NOTE — Assessment & Plan Note (Signed)
Status post STEMI 04/26/11 secondary to an occluded proximal LAD which I stented using a 3.5 mm x 18 mm long MultiLink vision bare-metal stent. His EF at that time was 30% with anteroapical akinesia. His peak CPK MB was 78 with a troponin of 21. The remainder of his epicardial arteries were normal. A followup 2-D echocardiogram performed 3 months later showed normal LV function and Myoview stress test showed complete salvage myocardium without evidence of scar. He's had no further chest pain.

## 2013-08-04 NOTE — Assessment & Plan Note (Signed)
On Crestor every other day. His primary care physician follows lipid profile.

## 2013-08-04 NOTE — Patient Instructions (Addendum)
Your physician wants you to follow-up in: 1 year with Dr Berry. You will receive a reminder letter in the mail two months in advance. If you don't receive a letter, please call our office to schedule the follow-up appointment.  

## 2013-08-04 NOTE — Progress Notes (Signed)
08/04/2013 Devin Norris   Feb 16, 1946  [id number]  Primary Physician Pcp Not In System Primary Cardiologist: Runell Gess MD Roseanne Reno   HPI:  The patient is a very pleasant [age]-year-old mildly overweight divorced Caucasian male, father of 2, grandfather to 2 grandchildren, who I last saw in the office 6 months ago. He works as an Surveyor, mining. His risk factors include a strong family history for heart disease. He suffered an anterior STEMI, April 30, 2011, at 4 o'clock in the afternoon, with an occluded proximal LAD, which I stented using a 3.5 x 18 mm long Multi-Link Vision bare-metal stent. His EF was 30% at that time with anteroapical akinesia. Peak CPK MB was 70 with a troponin of 21. His other epicardial arteries were normal. A followup echocardiogram and Myoview performed several months later revealed normal LV systolic function with complete salvage of myocardium and no evidence of scar. He really denies recurrent chest pain. His most recent lab work in September of this year revealed A. Total cholesterol 131, LDL of 66 and HDL of 34.    Current Outpatient Prescriptions  Medication Sig Dispense Refill  . aspirin 81 MG tablet Take 81 mg by mouth daily.      [name] atenolol (TENORMIN) 50 MG tablet Take 50 mg by mouth daily.      . clonazePAM (KLONOPIN) 0.5 MG tablet Take 0.5 mg by mouth 2 (two) times daily as needed for anxiety.      . fluticasone (FLONASE) 50 MCG/ACT nasal spray Place 2 sprays into the nose daily as needed for rhinitis.      [name] levothyroxine (SYNTHROID, LEVOTHROID) 50 MCG tablet Take 50 mcg by mouth daily before breakfast.      . losartan (COZAAR) 50 MG tablet Take 50 mg by mouth daily.      . Multiple Vitamins-Minerals (ICAPS PO) Take 2 capsules by mouth daily.      . Omega-3 Fatty Acids (FISH OIL PO) Take by mouth daily.      . rosuvastatin (CRESTOR) 40 MG tablet Take 1 tablet (40 mg total) by mouth daily.  30 tablet  6   No current  facility-administered medications for this visit.    Allergies  Allergen Reactions  . Contrast Media [Iodinated Diagnostic Agents]   . Ramipril Cough    History   Social History  . Marital Status: Divorced    Spouse Name: N/A    Number of Children: 2  . Years of Education: N/A   Occupational History  . Surveyor, mining    Social History Main Topics  . Smoking status: Never Smoker   . Smokeless tobacco: Not on file  . Alcohol Use: Not on file  . Drug Use: Not on file  . Sexual Activity: Not on file   Other Topics Concern  . Not on file   Social History Narrative  . No narrative on file     Review of Systems: General: negative for chills, fever, night sweats or weight changes.  Cardiovascular: negative for chest pain, dyspnea on exertion, edema, orthopnea, palpitations, paroxysmal nocturnal dyspnea or shortness of breath Dermatological: negative for rash Respiratory: negative for cough or wheezing Urologic: negative for hematuria Abdominal: negative for nausea, vomiting, diarrhea, bright red blood per rectum, melena, or hematemesis Neurologic: negative for visual changes, syncope, or dizziness All other systems reviewed and are otherwise negative except as noted above.    Blood pressure 150/80, pulse 79, height 5\' 9"  (1.753 m), weight 193 lb (87.544  kg).  General appearance: alert and no distress Neck: no adenopathy, no carotid bruit, no JVD, supple, symmetrical, trachea midline and thyroid not enlarged, symmetric, no tenderness/mass/nodules Lungs: clear to auscultation bilaterally Heart: regular rate and rhythm, S1, S2 normal, no murmur, click, rub or gallop Extremities: extremities normal, atraumatic, no cyanosis or edema  EKG normal sinus rhythm at 70 without ST or T wave changes  ASSESSMENT AND PLAN:   Coronary artery disease Status post STEMI 04/26/11 secondary to an occluded proximal LAD which I stented using a 3.5 mm x 18 mm long MultiLink vision  bare-metal stent. His EF at that time was 30% with anteroapical akinesia. His peak CPK MB was 78 with a troponin of 21. The remainder of his epicardial arteries were normal. A followup 2-D echocardiogram performed 3 months later showed normal LV function and Myoview stress test showed complete salvage myocardium without evidence of scar. He's had no further chest pain.  Hyperlipidemia On Crestor every other day. His primary care physician follows lipid profile.      Runell Gess MD FACP,FACC,FAHA, [facility] 08/04/2013 9:27 AM

## 2013-09-23 ENCOUNTER — Other Ambulatory Visit: Payer: Self-pay | Admitting: Cardiovascular Disease

## 2013-09-23 NOTE — Telephone Encounter (Signed)
Rx was sent to pharmacy electronically. 

## 2014-03-17 ENCOUNTER — Other Ambulatory Visit: Payer: Self-pay | Admitting: Cardiovascular Disease

## 2014-03-17 NOTE — Telephone Encounter (Signed)
Rx was sent to pharmacy electronically. 

## 2014-06-04 ENCOUNTER — Other Ambulatory Visit: Payer: Self-pay | Admitting: Cardiovascular Disease

## 2014-06-05 NOTE — Telephone Encounter (Signed)
Rx was sent to pharmacy electronically. 

## 2014-08-18 ENCOUNTER — Other Ambulatory Visit: Payer: Self-pay | Admitting: Cardiovascular Disease

## 2014-08-19 NOTE — Telephone Encounter (Signed)
Rx was sent to pharmacy electronically. 

## 2014-09-01 ENCOUNTER — Other Ambulatory Visit: Payer: Self-pay | Admitting: Cardiovascular Disease

## 2014-09-01 NOTE — Telephone Encounter (Signed)
Rx was sent to pharmacy electronically. 

## 2014-09-29 ENCOUNTER — Telehealth: Payer: Self-pay | Admitting: Cardiovascular Disease

## 2014-09-29 NOTE — Telephone Encounter (Signed)
Pt called in wanting to know if he still needs to be on his Losartan. Pt will be coming in to see Dr. Gwenlyn Found on 12/1. Please call  thanks

## 2014-09-29 NOTE — Telephone Encounter (Signed)
Informed pt. It would be better to ask Dr. Gwenlyn Found that when he sees him on the first

## 2014-10-06 ENCOUNTER — Encounter: Payer: Self-pay | Admitting: Cardiovascular Disease

## 2014-10-06 ENCOUNTER — Ambulatory Visit (INDEPENDENT_AMBULATORY_CARE_PROVIDER_SITE_OTHER): Payer: BC Managed Care – PPO | Admitting: Cardiovascular Disease

## 2014-10-06 VITALS — BP 162/96 | HR 57 | Ht 69.5 in | Wt 203.0 lb

## 2014-10-06 DIAGNOSIS — Z79899 Other long term (current) drug therapy: Secondary | ICD-10-CM

## 2014-10-06 DIAGNOSIS — I10 Essential (primary) hypertension: Secondary | ICD-10-CM

## 2014-10-06 DIAGNOSIS — E785 Hyperlipidemia, unspecified: Secondary | ICD-10-CM

## 2014-10-06 DIAGNOSIS — I2583 Coronary atherosclerosis due to lipid rich plaque: Secondary | ICD-10-CM

## 2014-10-06 DIAGNOSIS — I2510 Atherosclerotic heart disease of native coronary artery without angina pectoris: Secondary | ICD-10-CM

## 2014-10-06 LAB — HEPATIC FUNCTION PANEL
ALBUMIN: 4.3 g/dL (ref 3.5–5.2)
ALK PHOS: 60 U/L (ref 39–117)
ALT: 21 U/L (ref 0–53)
AST: 19 U/L (ref 0–37)
Bilirubin, Direct: 0.2 mg/dL (ref 0.0–0.3)
Indirect Bilirubin: 0.5 mg/dL (ref 0.2–1.2)
Total Bilirubin: 0.7 mg/dL (ref 0.2–1.2)
Total Protein: 6.6 g/dL (ref 6.0–8.3)

## 2014-10-06 LAB — LIPID PANEL
CHOL/HDL RATIO: 2.7 ratio
CHOLESTEROL: 128 mg/dL (ref 0–200)
HDL: 48 mg/dL (ref >39)
LDL Cholesterol: 67 mg/dL (ref 0–99)
Triglycerides: 67 mg/dL (ref <150)
VLDL: 13 mg/dL (ref 0–40)

## 2014-10-06 MED ORDER — ROSUVASTATIN CALCIUM 40 MG PO TABS: 40.0000 mg | ORAL_TABLET | Freq: Every day | ORAL | Status: DC

## 2014-10-06 MED ORDER — LOSARTAN POTASSIUM 100 MG PO TABS: 100.0000 mg | ORAL_TABLET | Freq: Every day | ORAL | Status: DC

## 2014-10-06 NOTE — Assessment & Plan Note (Addendum)
History of hyperlipidemia on Crestor 40 mg daily. We will recheck a lipid and liver profile.

## 2014-10-06 NOTE — Assessment & Plan Note (Signed)
History of CAD status post anterior STEMI 04/30/11 at 4:00 in the afternoon with an occluded proximal LAD which I stented using a 3.5 mm x 18 mm long Multi-Link vision bare metal stent. His ejection fraction at that time was 30% anteroapical akinesia. Here a peak CPK-MB of 70 and troponin of 21. His other epicardial arteries were free of significant disease. A follow-up echo Myoview were normal suggesting complete salvage of myocardium. He denies chest pain or shortness of breath.

## 2014-10-06 NOTE — Assessment & Plan Note (Signed)
History of hypertension with blood pressure measured today at 162/96. He is on atenolol 50 mg a day and losartan 50 mg a day. I'm going to increase his losartan to 100 mg a day.

## 2014-10-06 NOTE — Patient Instructions (Addendum)
Dr. Gwenlyn Found has:  INCREASED your Cozaar to 100 MG daily.  Ordered you to have some blood work done and you must be FASTING. Please complete in the next few weeks.  Your physician wants you to follow-up in 1 year with Dr. Gwenlyn Found. You will receive a reminder letter in the mail 2 months in advance. If you do not receive a letter, please call our office to schedule the follow-up appointment.

## 2014-10-06 NOTE — Progress Notes (Signed)
10/06/2014 Devin Norris   [birth year]  [id number]  Primary Physician Pcp Not In System Primary Cardiologist: Lorretta Harp MD Renae Gloss   HPI:  The patient is a very pleasant 68 -year-old mildly overweight divorced Caucasian male, father of 26, grandfather to 2 grandchildren, who I last saw in the office 14 months ago. He works as an Buyer, retail. His risk factors include a strong family history for heart disease. He suffered an anterior STEMI, April 30, 2011, at 4 o'clock in the afternoon, with an occluded proximal LAD, which I stented using a 3.5 x 18 mm long Multi-Link Vision bare-metal stent. His EF was 30% at that time with anteroapical akinesia. Peak CPK MB was 70 with a troponin of 21. His other epicardial arteries were normal. A followup echocardiogram and Myoview performed several months later revealed normal LV systolic function with complete salvage of myocardium and no evidence of scar. He really denies recurrent chest pain. His most recent lab work performed 07/17/13 revealed a total cholesterol of 131, LDL of 65 HDL of 54 .  Current Outpatient Prescriptions  Medication Sig Dispense Refill  . aspirin 81 MG tablet Take 81 mg by mouth daily.    [name] atenolol (TENORMIN) 50 MG tablet Take 50 mg by mouth daily.    . clonazePAM (KLONOPIN) 0.5 MG tablet Take 0.5 mg by mouth 2 (two) times daily as needed for anxiety.    . fluticasone (FLONASE) 50 MCG/ACT nasal spray Place 2 sprays into the nose daily as needed for rhinitis.    [name] levothyroxine (SYNTHROID, LEVOTHROID) 50 MCG tablet Take 50 mcg by mouth daily before breakfast.    . Multiple Vitamins-Minerals (ICAPS PO) Take 2 capsules by mouth daily.    . Omega-3 Fatty Acids (FISH OIL PO) Take by mouth daily.    . rosuvastatin (CRESTOR) 40 MG tablet Take 1 tablet (40 mg total) by mouth daily. <please make appointment for future refills> (Patient taking differently: Take 40 mg by mouth daily. Pt takes every 2-3 days) 30  tablet 2   No current facility-administered medications for this visit.    Allergies  Allergen Reactions  . Contrast Media [Iodinated Diagnostic Agents]   . Ramipril Cough    History   Social History  . Marital Status: Divorced    Spouse Name: N/A    Number of Children: 2  . Years of Education: N/A   Occupational History  . Buyer, retail    Social History Main Topics  . Smoking status: Never Smoker   . Smokeless tobacco: Not on file  . Alcohol Use: Not on file  . Drug Use: Not on file  . Sexual Activity: Not on file   Other Topics Concern  . Not on file   Social History Narrative     Review of Systems: General: negative for chills, fever, night sweats or weight changes.  Cardiovascular: negative for chest pain, dyspnea on exertion, edema, orthopnea, palpitations, paroxysmal nocturnal dyspnea or shortness of breath Dermatological: negative for rash Respiratory: negative for cough or wheezing Urologic: negative for hematuria Abdominal: negative for nausea, vomiting, diarrhea, bright red blood per rectum, melena, or hematemesis Neurologic: negative for visual changes, syncope, or dizziness All other systems reviewed and are otherwise negative except as noted above.    Blood pressure 162/96, pulse 57, height 5' 9.5" (1.765 m), weight 203 lb (92.08 kg).  General appearance: alert and no distress Neck: no adenopathy, no carotid bruit, no JVD, supple, symmetrical, trachea midline  and thyroid not enlarged, symmetric, no tenderness/mass/nodules Lungs: clear to auscultation bilaterally Heart: regular rate and rhythm, S1, S2 normal, no murmur, click, rub or gallop Extremities: extremities normal, atraumatic, no cyanosis or edema  EKG sinus bradycardia at 57 without ST or T-wave changes. I personally reviewed this EKG  ASSESSMENT AND PLAN:   Coronary artery disease History of CAD status post anterior STEMI 04/30/11 at 4:00 in the afternoon with an occluded proximal  LAD which I stented using a 3.5 mm x 18 mm long Multi-Link vision bare metal stent. His ejection fraction at that time was 30% anteroapical akinesia. Here a peak CPK-MB of 70 and troponin of 21. His other epicardial arteries were free of significant disease. A follow-up echo Myoview were normal suggesting complete salvage of myocardium. He denies chest pain or shortness of breath.  Hyperlipidemia History of hyperlipidemia on Crestor 40 mg daily. We will recheck a lipid and liver profile.  Essential hypertension History of hypertension with blood pressure measured today at 162/96. He is on atenolol 50 mg a day and losartan 50 mg a day. I'm going to increase his losartan to 100 mg a day.      Lorretta Harp MD FACP,FACC,FAHA, [facility] 10/06/2014 9:42 AM

## 2015-04-14 ENCOUNTER — Telehealth: Payer: Self-pay | Admitting: Cardiovascular Disease

## 2015-04-14 NOTE — Telephone Encounter (Signed)
I have called patient to inform him that we may be calling tomorrow to set up flex clinic visit - he did not pickup, so a detailed VM was left. Emphasized that no specific appt time was set up yet and that we would call with this information.  I sent Devin Norris a staff message. Would request [facility] staff in triage pool follow up on this in AM - I will be out of office.

## 2015-04-14 NOTE — Telephone Encounter (Signed)
He is to be seen by a PA in the FLEX clinic. May need a stress test.

## 2015-04-14 NOTE — Telephone Encounter (Signed)
No answer. Left message for patient to call.

## 2015-04-14 NOTE — Telephone Encounter (Signed)
Pt of Dr. Gwenlyn Found.  Hx of STEMI in 2012  I do not note recent stress test or echo.  Pt had onset of mild, midsternal CP on Monday that resolved on Tuesday. Intermittent, no dyspnea, no pain elsewhere, no other symptoms; patient describes as "just hurting".  Took ASA 81mg . O/w no intervention, did not go to ED w/ symptoms.  Advised pt ED eval if symptoms return. Last seen in office Dec '15 w/ recall for yearly f/u. Informed I would route to Dr. Gwenlyn Found for advice - likely arrange PA OV, see if any testing recommended.  Pt verbalized acknowledgement of plan.

## 2015-04-14 NOTE — Telephone Encounter (Signed)
Pt c/o of Chest Pain: 1. Are you having CP right now? no 2. Are you experiencing any other symptoms (ex. SOB, nausea, vomiting, sweating)? no 3. How long have you been experiencing CP? 6 or 7 days ago 4. Is your CP continuous or coming and going? Coming and going 5. Have you taken Nitroglycerin? No  Pt had pain in the middle of his chest   pt is ok now but was out of town at the time he had the cp

## 2015-04-15 ENCOUNTER — Encounter: Payer: Self-pay | Admitting: Physician Assistant

## 2015-04-15 ENCOUNTER — Ambulatory Visit (INDEPENDENT_AMBULATORY_CARE_PROVIDER_SITE_OTHER): Payer: BLUE CROSS/BLUE SHIELD | Admitting: Physician Assistant

## 2015-04-15 VITALS — BP 151/75 | HR 69 | Ht 69.5 in | Wt 199.0 lb

## 2015-04-15 DIAGNOSIS — I2510 Atherosclerotic heart disease of native coronary artery without angina pectoris: Secondary | ICD-10-CM | POA: Diagnosis not present

## 2015-04-15 DIAGNOSIS — E785 Hyperlipidemia, unspecified: Secondary | ICD-10-CM

## 2015-04-15 DIAGNOSIS — I10 Essential (primary) hypertension: Secondary | ICD-10-CM | POA: Diagnosis not present

## 2015-04-15 DIAGNOSIS — R079 Chest pain, unspecified: Secondary | ICD-10-CM | POA: Diagnosis not present

## 2015-04-15 DIAGNOSIS — I255 Ischemic cardiomyopathy: Secondary | ICD-10-CM | POA: Diagnosis not present

## 2015-04-15 MED ORDER — LOSARTAN POTASSIUM-HCTZ 100-12.5 MG PO TABS: 1.0000 | ORAL_TABLET | Freq: Every day | ORAL | Status: DC

## 2015-04-15 NOTE — Progress Notes (Signed)
Cardiology Office Note   Date:  04/15/2015   ID:  Devin Norris, DOB [birth year], MRN [id number]  PCP:  Pcp Not In System  Cardiologist:  Dr. Quay Burow     Chief Complaint  Patient presents with  . Chest Pain  . Coronary Artery Disease     History of Present Illness: Devin Norris is a 69 y.o. male with a hx of CAD s/p Anterior STEMI 6/12 treated with a BMS to the LAD, ICM with EF 30% at time of MI (full recovery to normal LVF with EF 50-55% by echo in 9/12), HTN, HL.  Last seen by Dr. Quay Burow 10/2014.    Patient called in recently with chest discomfort.  He is added on for evaluation.  He was traveling to [place] last week.  He developed substernal chest pressure while driving that persisted for about 2 days.  He had no associated symptoms. It was not like his previous angina.  He has not had a recurrence since it resolved. He remains quite active.  He denies exertional chest pain, dyspnea.  He denies syncope. He denies orthopnea, PND, edema.  He did have a slight cough and some belching around the time he had chest pain.     Studies/Reports Reviewed Today:  Myoview 07/2011 Mild LVH, EF 50-55% Impaired LV relaxation Mild MR, mild TR  Echo 07/2011 Low Risk, fixed defect mid anterior wall, no ischemia, EF 59%  LHC 04/2011 LM:  Normal LAD:  Proximal 100% LCx:  Ok RCA:  Ok EF 30% with anteroapical AK PCI:  3.5 x 18 Vision BMS to LAD  Past Medical History  Diagnosis Date  . STEMI (ST elevation myocardial infarction) 04/30/2011    anterior wall  . Family history of heart disease   . History of nuclear stress test 07/28/2011    bruce protocol, f/up MI; normal, without evidence of scar  . CAD (coronary artery disease)   . Hyperlipidemia     Past Surgical History  Procedure Laterality Date  . Coronary angioplasty with stent placement  04/30/2011    r/t anterior wall STEMI; 3.5x83mm Multi-Link Vision bare metal stent to LAD  . Transthoracic  echocardiogram  07/28/2011    EF 50-55%, mild conc LVH; mild MR/TR; mild calcif of aortic valve leaflets     Current Outpatient Prescriptions  Medication Sig Dispense Refill  . aspirin 81 MG tablet Take 81 mg by mouth daily.    [name] atenolol (TENORMIN) 50 MG tablet Take 50 mg by mouth daily.    . clonazePAM (KLONOPIN) 0.5 MG tablet Take 0.5 mg by mouth 2 (two) times daily as needed for anxiety.    . fluticasone (FLONASE) 50 MCG/ACT nasal spray Place 2 sprays into the nose daily as needed for rhinitis.    [name] levothyroxine (SYNTHROID, LEVOTHROID) 50 MCG tablet Take 50 mcg by mouth daily before breakfast.    . Multiple Vitamins-Minerals (ICAPS PO) Take 2 capsules by mouth daily.    . Omega-3 Fatty Acids (FISH OIL PO) Take by mouth daily.    . rosuvastatin (CRESTOR) 40 MG tablet Take 1 tablet (40 mg total) by mouth daily. 30 tablet 11  . losartan-hydrochlorothiazide (HYZAAR) 100-12.5 MG per tablet Take 1 tablet by mouth daily. 30 tablet 11   No current facility-administered medications for this visit.    Allergies:   Contrast media and Ramipril    Social History:  The patient  reports that he has never smoked. He does not have any  smokeless tobacco history on file.   Family History:  The patient's family history includes Heart attack in his brother and father; Heart disease in his brother, father, and mother; Hypertension in his brother and father; Lung cancer in his father; Stroke in his paternal grandfather.    ROS:   Please see the history of present illness.   Review of Systems  Cardiovascular: Positive for chest pain.  Genitourinary: Positive for hematuria.  All other systems reviewed and are negative.     PHYSICAL EXAM: VS:  BP 151/75 mmHg  Pulse 69  Ht 5' 9.5" (1.765 m)  Wt 199 lb (90.266 kg)  BMI 28.98 kg/m2    Wt Readings from Last 3 Encounters:  04/15/15 199 lb (90.266 kg)  10/06/14 203 lb (92.08 kg)  08/04/13 193 lb (87.544 kg)     GEN: Well nourished, well  developed, in no acute distress HEENT: normal Neck: no JVD,  no masses Cardiac:  Normal S1/S2, RRR; no murmur ,  no rubs or gallops, no edema   Respiratory:  clear to auscultation bilaterally, no wheezing, rhonchi or rales. GI: soft, nontender, nondistended, + BS MS: no deformity or atrophy Skin: warm and dry  Neuro:  CNs II-XII intact, Strength and sensation are intact Psych: Normal affect   EKG:  EKG is ordered today.  It demonstrates:   NSR, HR 69, normal axis, no change from prior tracing.   Recent Labs: 10/06/2014: ALT 21    Lipid Panel    Component Value Date/Time   CHOL 128 10/06/2014 1001   TRIG 67 10/06/2014 1001   HDL 48 10/06/2014 1001   CHOLHDL 2.7 10/06/2014 1001   VLDL 13 10/06/2014 1001   LDLCALC 67 10/06/2014 1001      ASSESSMENT AND PLAN:  Chest Pain:  Symptoms are somewhat atypical.  Symptoms occurred while driving.  But, he has not had any signs or symptoms of acute thromboembolism.  It has been 4 years since he last assessment for ischemia.  Current ECG is unchanged.  I will set him up for a ETT-Myoview to rule out ischemia.    Coronary artery disease involving native coronary artery of native heart without angina pectoris:  Proceed with myoview as noted.  Continue ASA, beta-blocker, angiotensin receptor blocker, statin.   Ischemic cardiomyopathy:  He had normalization of LVEF post PCI.  Continue beta-blocker, angiotensin receptor blocker.   Essential hypertension:  BP is above target.  DC Losartan.  Start Hyzaar 100/12.5 mg QD. Check BMET 1 week.   Hyperlipidemia:  Continue statin.  LDL at goal in 10/2014.     Current medicines are reviewed at length with the patient today.  Concerns regarding medicines are as outlined above.  The following changes have been made:    DC Losartan  Start Hyzaar 100/12.5 mg QD   Labs/ tests ordered today include:  Orders Placed This Encounter  Procedures  . Basic Metabolic Panel (BMET)  . Myocardial Perfusion  Imaging  . EKG 12-Lead    Disposition:   FU with Dr. Quay Burow 1-2 mos or sooner if Myoview abnormal.     Signed, Versie Starks, MHS 04/15/2015 2:56 PM    Devin Norris, [place],   [id number] Phone: [phone]; Fax: [phone]

## 2015-04-15 NOTE — Patient Instructions (Signed)
Medication Instructions:  1. STOP LOSARTAN 2. START HYZAAR 100/12.5 MG 1 TAB DAILY  Labwork: 1 WEEK AFTER YOUR 1ST DOSE OF HYZAAR  Testing/Procedures: Your physician has requested that you have en exercise stress myoview. For further information please visit [email or web address]. Please follow instruction sheet, as given.  Follow-Up: DR. Gwenlyn Found IN 1-2 MONTHS OR NP/PA IN THE [facility]  Any Other Special Instructions Will Be Listed Below (If Applicable).

## 2015-04-15 NOTE — Telephone Encounter (Signed)
Appointment scheduled for 6/9 @ 2pm with Kathleen Argue, PA Patient notified of appointment date/time/location

## 2015-04-19 ENCOUNTER — Other Ambulatory Visit: Payer: Self-pay | Admitting: *Deleted

## 2015-04-19 ENCOUNTER — Ambulatory Visit (INDEPENDENT_AMBULATORY_CARE_PROVIDER_SITE_OTHER): Payer: BLUE CROSS/BLUE SHIELD | Admitting: Nurse Practitioner

## 2015-04-19 ENCOUNTER — Encounter (HOSPITAL_COMMUNITY)
Admission: AD | Disposition: A | Discharge status: Home / Self Care | Payer: BLUE CROSS/BLUE SHIELD | Source: Ambulatory Visit | Attending: Cardiovascular Disease

## 2015-04-19 ENCOUNTER — Other Ambulatory Visit: Payer: Self-pay | Admitting: Nurse Practitioner

## 2015-04-19 ENCOUNTER — Telehealth: Payer: Self-pay | Admitting: *Deleted

## 2015-04-19 ENCOUNTER — Encounter (HOSPITAL_COMMUNITY): Payer: Self-pay | Admitting: *Deleted

## 2015-04-19 ENCOUNTER — Observation Stay (HOSPITAL_COMMUNITY): Payer: BLUE CROSS/BLUE SHIELD

## 2015-04-19 ENCOUNTER — Observation Stay (HOSPITAL_COMMUNITY)
Admission: AD | Admit: 2015-04-19 | Discharge: 2015-04-19 | Disposition: A | Discharge status: Home / Self Care | Payer: BLUE CROSS/BLUE SHIELD | Source: Ambulatory Visit | Attending: Cardiology | Admitting: Cardiology

## 2015-04-19 ENCOUNTER — Encounter: Payer: Self-pay | Admitting: Nurse Practitioner

## 2015-04-19 VITALS — BP 180/110 | HR 64 | Ht 69.0 in | Wt 198.8 lb

## 2015-04-19 DIAGNOSIS — T82858A Stenosis of vascular prosthetic devices, implants and grafts, initial encounter: Secondary | ICD-10-CM | POA: Insufficient documentation

## 2015-04-19 DIAGNOSIS — I200 Unstable angina: Secondary | ICD-10-CM | POA: Diagnosis present

## 2015-04-19 DIAGNOSIS — I25110 Atherosclerotic heart disease of native coronary artery with unstable angina pectoris: Secondary | ICD-10-CM | POA: Diagnosis not present

## 2015-04-19 DIAGNOSIS — I255 Ischemic cardiomyopathy: Secondary | ICD-10-CM | POA: Diagnosis not present

## 2015-04-19 DIAGNOSIS — E785 Hyperlipidemia, unspecified: Secondary | ICD-10-CM

## 2015-04-19 DIAGNOSIS — R079 Chest pain, unspecified: Secondary | ICD-10-CM | POA: Insufficient documentation

## 2015-04-19 DIAGNOSIS — I10 Essential (primary) hypertension: Secondary | ICD-10-CM | POA: Diagnosis present

## 2015-04-19 DIAGNOSIS — Z91041 Radiographic dye allergy status: Secondary | ICD-10-CM | POA: Insufficient documentation

## 2015-04-19 DIAGNOSIS — I252 Old myocardial infarction: Secondary | ICD-10-CM | POA: Diagnosis not present

## 2015-04-19 DIAGNOSIS — Z7982 Long term (current) use of aspirin: Secondary | ICD-10-CM | POA: Insufficient documentation

## 2015-04-19 DIAGNOSIS — R0789 Other chest pain: Secondary | ICD-10-CM | POA: Diagnosis present

## 2015-04-19 DIAGNOSIS — I2510 Atherosclerotic heart disease of native coronary artery without angina pectoris: Secondary | ICD-10-CM | POA: Diagnosis present

## 2015-04-19 DIAGNOSIS — E039 Hypothyroidism, unspecified: Secondary | ICD-10-CM | POA: Insufficient documentation

## 2015-04-19 DIAGNOSIS — Z955 Presence of coronary angioplasty implant and graft: Secondary | ICD-10-CM | POA: Insufficient documentation

## 2015-04-19 HISTORY — PX: CARDIAC CATHETERIZATION: SHX172

## 2015-04-19 LAB — CBC WITH DIFFERENTIAL/PLATELET
Basophils Absolute: 0 10*3/uL (ref 0.0–0.1)
Basophils Relative: 0 % (ref 0–1)
Eosinophils Absolute: 0.1 10*3/uL (ref 0.0–0.7)
Eosinophils Relative: 1 % (ref 0–5)
HCT: 45.3 % (ref 39.0–52.0)
Hemoglobin: 16 g/dL (ref 13.0–17.0)
Lymphocytes Relative: 8 % — ABNORMAL LOW (ref 12–46)
Lymphs Abs: 0.7 10*3/uL (ref 0.7–4.0)
MCH: 31.5 pg (ref 26.0–34.0)
MCHC: 35.3 g/dL (ref 30.0–36.0)
MCV: 89.2 fL (ref 78.0–100.0)
Monocytes Absolute: 0.4 10*3/uL (ref 0.1–1.0)
Monocytes Relative: 5 % (ref 3–12)
Neutro Abs: 7.4 10*3/uL (ref 1.7–7.7)
Neutrophils Relative %: 86 % — ABNORMAL HIGH (ref 43–77)
Platelets: 151 10*3/uL (ref 150–400)
RBC: 5.08 MIL/uL (ref 4.22–5.81)
RDW: 13.6 % (ref 11.5–15.5)
WBC: 8.5 10*3/uL (ref 4.0–10.5)

## 2015-04-19 LAB — COMPREHENSIVE METABOLIC PANEL
ALT: 20 U/L (ref 17–63)
AST: 18 U/L (ref 15–41)
Albumin: 4.1 g/dL (ref 3.5–5.0)
Alkaline Phosphatase: 69 U/L (ref 38–126)
Anion gap: 6 (ref 5–15)
BUN: 17 mg/dL (ref 6–20)
CO2: 29 mmol/L (ref 22–32)
Calcium: 9.5 mg/dL (ref 8.9–10.3)
Chloride: 105 mmol/L (ref 101–111)
Creatinine, Ser: 1.26 mg/dL — ABNORMAL HIGH (ref 0.61–1.24)
GFR calc Af Amer: >60 mL/min (ref >60)
GFR calc non Af Amer: 57 mL/min — ABNORMAL LOW (ref >60)
Glucose, Bld: 103 mg/dL — ABNORMAL HIGH (ref 65–99)
Potassium: 4.5 mmol/L (ref 3.5–5.1)
Sodium: 140 mmol/L (ref 135–145)
Total Bilirubin: 0.8 mg/dL (ref 0.3–1.2)
Total Protein: 6.7 g/dL (ref 6.5–8.1)

## 2015-04-19 LAB — HEPARIN LEVEL (UNFRACTIONATED)

## 2015-04-19 LAB — PROTIME-INR
INR: 1.13 (ref 0.00–1.49)
Prothrombin Time: 14.7 seconds (ref 11.6–15.2)

## 2015-04-19 LAB — APTT: aPTT: 32 seconds (ref 24–37)

## 2015-04-19 LAB — TROPONIN I: Troponin I: <0.03 ng/mL (ref <0.031)

## 2015-04-19 LAB — TSH: TSH: 2.867 u[IU]/mL (ref 0.350–4.500)

## 2015-04-19 LAB — MRSA PCR SCREENING: MRSA by PCR: NEGATIVE

## 2015-04-19 LAB — MAGNESIUM: Magnesium: 2.1 mg/dL (ref 1.7–2.4)

## 2015-04-19 SURGERY — LEFT HEART CATH AND CORONARY ANGIOGRAPHY
Anesthesia: LOCAL

## 2015-04-19 MED ORDER — NITROGLYCERIN IN D5W 200-5 MCG/ML-% IV SOLN
3.0000 ug/min | INTRAVENOUS | Status: DC
  Administered 2015-04-19: 3 ug/min via INTRAVENOUS
  Filled 2015-04-19: qty 250

## 2015-04-19 MED ORDER — VERAPAMIL HCL 2.5 MG/ML IV SOLN
INTRAVENOUS | Status: AC
  Filled 2015-04-19: qty 2

## 2015-04-19 MED ORDER — SODIUM CHLORIDE 0.9 % IV SOLN
INTRAVENOUS | Status: DC
  Administered 2015-04-19: 15:00:00 via INTRAVENOUS

## 2015-04-19 MED ORDER — HEPARIN (PORCINE) IN NACL 100-0.45 UNIT/ML-% IJ SOLN
1000.0000 [IU]/h | INTRAMUSCULAR | Status: DC
  Administered 2015-04-19: 1000 [IU]/h via INTRAVENOUS
  Filled 2015-04-19: qty 250

## 2015-04-19 MED ORDER — DIPHENHYDRAMINE HCL 50 MG/ML IJ SOLN: 25.0000 mg | INTRAMUSCULAR | Status: DC

## 2015-04-19 MED ORDER — NITROGLYCERIN 0.4 MG SL SUBL: 0.4000 mg | SUBLINGUAL_TABLET | SUBLINGUAL | Status: DC | PRN

## 2015-04-19 MED ORDER — SODIUM CHLORIDE 0.9 % IJ SOLN: 3.0000 mL | INTRAMUSCULAR | Status: DC | PRN

## 2015-04-19 MED ORDER — ASPIRIN EC 81 MG PO TBEC: 81.0000 mg | DELAYED_RELEASE_TABLET | Freq: Every day | ORAL | Status: DC

## 2015-04-19 MED ORDER — DIPHENHYDRAMINE HCL 50 MG/ML IJ SOLN
25.0000 mg | INTRAMUSCULAR | Status: AC
  Administered 2015-04-19: 25 mg via INTRAVENOUS
  Filled 2015-04-19: qty 1

## 2015-04-19 MED ORDER — SODIUM CHLORIDE 0.9 % WEIGHT BASED INFUSION: 3.0000 mL/kg/h | INTRAVENOUS | Status: AC

## 2015-04-19 MED ORDER — LIDOCAINE HCL (PF) 1 % IJ SOLN
INTRAMUSCULAR | Status: AC
  Filled 2015-04-19: qty 30

## 2015-04-19 MED ORDER — METHYLPREDNISOLONE SODIUM SUCC 125 MG IJ SOLR
125.0000 mg | INTRAMUSCULAR | Status: AC
  Administered 2015-04-19: 125 mg via INTRAVENOUS
  Filled 2015-04-19 (×2): qty 2

## 2015-04-19 MED ORDER — FAMOTIDINE IN NACL 20-0.9 MG/50ML-% IV SOLN
20.0000 mg | INTRAVENOUS | Status: DC
  Filled 2015-04-19: qty 50

## 2015-04-19 MED ORDER — CLONAZEPAM 0.5 MG PO TABS: 0.5000 mg | ORAL_TABLET | Freq: Two times a day (BID) | ORAL | Status: DC | PRN

## 2015-04-19 MED ORDER — ASPIRIN 81 MG PO CHEW
324.0000 mg | CHEWABLE_TABLET | ORAL | Status: AC
Start: 2015-04-19 — End: 2015-04-19
  Administered 2015-04-19: 324 mg via ORAL
  Filled 2015-04-19: qty 4

## 2015-04-19 MED ORDER — VERAPAMIL HCL 2.5 MG/ML IV SOLN
INTRAVENOUS | Status: DC | PRN
  Administered 2015-04-19: 18:00:00 via INTRA_ARTERIAL

## 2015-04-19 MED ORDER — SODIUM CHLORIDE 0.9 % IV SOLN: INTRAVENOUS | Status: DC

## 2015-04-19 MED ORDER — ASPIRIN 300 MG RE SUPP: 300.0000 mg | RECTAL | Status: AC

## 2015-04-19 MED ORDER — LOSARTAN POTASSIUM 50 MG PO TABS: 100.0000 mg | ORAL_TABLET | Freq: Every day | ORAL | Status: DC

## 2015-04-19 MED ORDER — ATENOLOL 50 MG PO TABS
50.0000 mg | ORAL_TABLET | Freq: Every day | ORAL | Status: DC
  Filled 2015-04-19: qty 1

## 2015-04-19 MED ORDER — FAMOTIDINE IN NACL 20-0.9 MG/50ML-% IV SOLN
INTRAVENOUS | Status: AC
  Filled 2015-04-19: qty 50

## 2015-04-19 MED ORDER — HYDROCHLOROTHIAZIDE 12.5 MG PO CAPS: 12.5000 mg | ORAL_CAPSULE | Freq: Every day | ORAL | Status: DC

## 2015-04-19 MED ORDER — FENTANYL CITRATE (PF) 100 MCG/2ML IJ SOLN
INTRAMUSCULAR | Status: DC | PRN
  Administered 2015-04-19: 50 ug via INTRAVENOUS

## 2015-04-19 MED ORDER — LOSARTAN POTASSIUM-HCTZ 100-12.5 MG PO TABS: 1.0000 | ORAL_TABLET | Freq: Every day | ORAL | Status: DC

## 2015-04-19 MED ORDER — ONDANSETRON HCL 4 MG/2ML IJ SOLN: 4.0000 mg | Freq: Four times a day (QID) | INTRAMUSCULAR | Status: DC | PRN

## 2015-04-19 MED ORDER — HYDROCHLOROTHIAZIDE 25 MG PO TABS
25.0000 mg | ORAL_TABLET | Freq: Every day | ORAL | Status: DC
  Administered 2015-04-19: 25 mg via ORAL
  Filled 2015-04-19: qty 1

## 2015-04-19 MED ORDER — ACETAMINOPHEN 325 MG PO TABS: 650.0000 mg | ORAL_TABLET | ORAL | Status: DC | PRN

## 2015-04-19 MED ORDER — HEPARIN (PORCINE) IN NACL 2-0.9 UNIT/ML-% IJ SOLN
INTRAMUSCULAR | Status: AC
  Filled 2015-04-19: qty 1000

## 2015-04-19 MED ORDER — ROSUVASTATIN CALCIUM 20 MG PO TABS: 40.0000 mg | ORAL_TABLET | Freq: Every day | ORAL | Status: DC

## 2015-04-19 MED ORDER — MIDAZOLAM HCL 2 MG/2ML IJ SOLN
INTRAMUSCULAR | Status: AC
  Filled 2015-04-19: qty 2

## 2015-04-19 MED ORDER — LEVOTHYROXINE SODIUM 50 MCG PO TABS: 50.0000 ug | ORAL_TABLET | Freq: Every day | ORAL | Status: DC

## 2015-04-19 MED ORDER — NITROGLYCERIN 1 MG/10 ML FOR IR/CATH LAB
INTRA_ARTERIAL | Status: AC
  Filled 2015-04-19: qty 10

## 2015-04-19 MED ORDER — SODIUM CHLORIDE 0.9 % IJ SOLN: 3.0000 mL | Freq: Two times a day (BID) | INTRAMUSCULAR | Status: DC

## 2015-04-19 MED ORDER — LEVOTHYROXINE SODIUM 50 MCG PO TABS
50.0000 ug | ORAL_TABLET | Freq: Every day | ORAL | Status: DC
  Filled 2015-04-19: qty 1

## 2015-04-19 MED ORDER — NITROGLYCERIN 0.4 MG SL SUBL
0.4000 mg | SUBLINGUAL_TABLET | SUBLINGUAL | Status: AC
  Administered 2015-04-19: 0.4 mg via SUBLINGUAL

## 2015-04-19 MED ORDER — FLUTICASONE PROPIONATE 50 MCG/ACT NA SUSP
2.0000 | Freq: Every day | NASAL | Status: DC | PRN
  Filled 2015-04-19: qty 16

## 2015-04-19 MED ORDER — HEPARIN SODIUM (PORCINE) 1000 UNIT/ML IJ SOLN
INTRAMUSCULAR | Status: AC
  Filled 2015-04-19: qty 1

## 2015-04-19 MED ORDER — SODIUM CHLORIDE 0.9 % IV SOLN: 250.0000 mL | INTRAVENOUS | Status: DC | PRN

## 2015-04-19 MED ORDER — MIDAZOLAM HCL 2 MG/2ML IJ SOLN
INTRAMUSCULAR | Status: DC | PRN
  Administered 2015-04-19: 2 mg via INTRAVENOUS

## 2015-04-19 MED ORDER — ROSUVASTATIN CALCIUM 40 MG PO TABS
40.0000 mg | ORAL_TABLET | Freq: Every day | ORAL | Status: DC
  Filled 2015-04-19: qty 1

## 2015-04-19 MED ORDER — METHYLPREDNISOLONE SODIUM SUCC 125 MG IJ SOLR: 125.0000 mg | INTRAMUSCULAR | Status: DC

## 2015-04-19 MED ORDER — FAMOTIDINE IN NACL 20-0.9 MG/50ML-% IV SOLN
INTRAVENOUS | Status: DC | PRN
  Administered 2015-04-19: 20 mg via INTRAVENOUS

## 2015-04-19 MED ORDER — ASPIRIN 81 MG PO CHEW: 81.0000 mg | CHEWABLE_TABLET | ORAL | Status: DC

## 2015-04-19 MED ORDER — ATENOLOL 50 MG PO TABS
50.0000 mg | ORAL_TABLET | Freq: Every day | ORAL | Status: DC
  Administered 2015-04-19: 50 mg via ORAL
  Filled 2015-04-19: qty 1

## 2015-04-19 MED ORDER — HEPARIN SODIUM (PORCINE) 1000 UNIT/ML IJ SOLN
INTRAMUSCULAR | Status: DC | PRN
Start: 2015-04-19 — End: 2015-04-19
  Administered 2015-04-19: 4500 [IU] via INTRAVENOUS

## 2015-04-19 MED ORDER — MORPHINE SULFATE 2 MG/ML IJ SOLN: 2.0000 mg | INTRAMUSCULAR | Status: DC | PRN

## 2015-04-19 MED ORDER — HEPARIN BOLUS VIA INFUSION
4000.0000 [IU] | Freq: Once | INTRAVENOUS | Status: AC
  Administered 2015-04-19: 4000 [IU] via INTRAVENOUS
  Filled 2015-04-19: qty 4000

## 2015-04-19 MED ORDER — FENTANYL CITRATE (PF) 100 MCG/2ML IJ SOLN
INTRAMUSCULAR | Status: AC
  Filled 2015-04-19: qty 2

## 2015-04-19 MED ORDER — IOHEXOL 350 MG/ML SOLN
INTRAVENOUS | Status: DC | PRN
  Administered 2015-04-19: 80 mL via INTRA_ARTERIAL

## 2015-04-19 SURGICAL SUPPLY — 9 items
CATH INFINITI 5FR ANG PIGTAIL (CATHETERS) ×2 IMPLANT
CATH OPTITORQUE TIG 4.0 5F (CATHETERS) ×2 IMPLANT
DEVICE RAD COMP TR BAND LRG (VASCULAR PRODUCTS) ×2 IMPLANT
GLIDESHEATH SLEND A-KIT 6F 22G (SHEATH) ×2 IMPLANT
KIT HEART LEFT (KITS) ×2 IMPLANT
PACK CARDIAC CATHETERIZATION (CUSTOM PROCEDURE TRAY) ×2 IMPLANT
TRANSDUCER W/STOPCOCK (MISCELLANEOUS) ×2 IMPLANT
TUBING CIL FLEX 10 FLL-RA (TUBING) ×2 IMPLANT
WIRE SAFE-T 1.5MM-J .035X260CM (WIRE) ×2 IMPLANT

## 2015-04-19 NOTE — Progress Notes (Signed)
CARDIOLOGY OFFICE NOTE  Date:  04/19/2015    [name] Date of Birth: 1946-04-04 Medical Record #[id number]  PCP:  Pcp Not In System  Cardiologist:  Gwenlyn Found  Chief Complaint  Patient presents with  . Chest Pain    Work in visit - patient walked in - seen for Dr. Gwenlyn Found    History of Present Illness: Devin Norris is a 69 y.o. male who presents today for a work in visit. Seen for Dr. Gwenlyn Found. He has a hx of CAD s/p Anterior STEMI 6/12 treated with a BMS to the LAD, ICM with EF 30% at time of MI (full recovery to normal LVF with EF 50-55% by echo in 9/12), HTN, HL and hypothyroidism.   Last seen by Dr. Quay Burow 10/2014.   Seen last week in the FLEX for chest pain. He had been traveling to [place] last week. He developed substernal chest pressure while driving that persisted for about 2 days. He had no associated symptoms. It was not like his previous angina.  He remains quite active.Myoview was ordered.  Walks in today. Here alone. More chest pain. Thus added to the FLEX again.  Comes in today. Here alone. He notes that he walked back he because he is "concerned". His discomfort has continued over the course of the weekend. Never really goes away. He has limited his activities over the weekend. Has not been in the heat. Describes it as an "aching" sensation/heaviness. Was more midsternal and now to the left of his chest. No real associated symptoms. BP quite high. He has not been checking at home. This still feels different from his prior presentation.   Past Medical History  Diagnosis Date  . STEMI (ST elevation myocardial infarction) 04/30/2011    anterior wall  . Family history of heart disease   . History of nuclear stress test 07/28/2011    bruce protocol, f/up MI; normal, without evidence of scar  . CAD (coronary artery disease)   . Hyperlipidemia     Past Surgical History  Procedure Laterality Date  . Coronary angioplasty with stent placement   04/30/2011    r/t anterior wall STEMI; 3.5x35mm Multi-Link Vision bare metal stent to LAD  . Transthoracic echocardiogram  07/28/2011    EF 50-55%, mild conc LVH; mild MR/TR; mild calcif of aortic valve leaflets     Medications: Current Outpatient Prescriptions  Medication Sig Dispense Refill  . aspirin 81 MG tablet Take 81 mg by mouth daily.    [name] atenolol (TENORMIN) 50 MG tablet Take 50 mg by mouth daily.    . clonazePAM (KLONOPIN) 0.5 MG tablet Take 0.5 mg by mouth 2 (two) times daily as needed for anxiety.    . fluticasone (FLONASE) 50 MCG/ACT nasal spray Place 2 sprays into the nose daily as needed for rhinitis.    [name] levothyroxine (SYNTHROID, LEVOTHROID) 50 MCG tablet Take 50 mcg by mouth daily before breakfast.    . losartan-hydrochlorothiazide (HYZAAR) 100-12.5 MG per tablet Take 1 tablet by mouth daily. 30 tablet 11  . Multiple Vitamins-Minerals (ICAPS PO) Take 2 capsules by mouth daily.    . nitroGLYCERIN (NITROSTAT) 0.4 MG SL tablet Place 0.4 mg under the tongue every 5 (five) minutes as needed for chest pain (as needed for chest pain).    . rosuvastatin (CRESTOR) 40 MG tablet Take 1 tablet (40 mg total) by mouth daily. 30 tablet 11   No current facility-administered medications for this visit.  Allergies: Allergies  Allergen Reactions  . Contrast Media [Iodinated Diagnostic Agents]   . Ramipril Cough    Social History: The patient  reports that he has never smoked. He does not have any smokeless tobacco history on file.   Family History: The patient's family history includes Heart attack in his brother and father; Heart disease in his brother, father, and mother; Hypertension in his brother and father; Lung cancer in his father; Stroke in his paternal grandfather.   Review of Systems: Please see the history of present illness.   Otherwise, the review of systems is positive for none.   All other systems are reviewed and negative.   Physical Exam: VS:  BP 180/110 mmHg   Pulse 64  Ht 5\' 9"  (1.753 m)  Wt 198 lb 12.8 oz (90.175 kg)  BMI 29.34 kg/m2  SpO2 98% .  BMI Body mass index is 29.34 kg/(m^2).  Wt Readings from Last 3 Encounters:  04/19/15 198 lb 12.8 oz (90.175 kg)  04/15/15 199 lb (90.266 kg)  10/06/14 203 lb (92.08 kg)    General: Pleasant. Well developed, well nourished and in no acute distress.  HEENT: Normal. Neck: Supple, no JVD, carotid bruits, or masses noted.  Cardiac: Regular rate and rhythm. No murmurs, rubs, or gallops. No edema.  Respiratory:  Lungs are clear to auscultation bilaterally with normal work of breathing.  GI: Soft and nontender.  MS: No deformity or atrophy. Gait and ROM intact. Skin: Warm and dry. Color is normal.  Neuro:  Strength and sensation are intact and no gross focal deficits noted.  Psych: Alert, appropriate and with normal affect.   LABORATORY DATA:  EKG:  EKG is ordered today. This demonstrates sinus bradycardia with 1 mm ST elevation noted - reviewed with Dr. Tamala Julian (DOD).  Lab Results  Component Value Date   WBC 9.2 05/04/2011   HGB 15.9 05/04/2011   HCT 44.2 05/04/2011   PLT 126* 05/04/2011   GLUCOSE 88 07/17/2013   CHOL 128 10/06/2014   TRIG 67 10/06/2014   HDL 48 10/06/2014   LDLCALC 67 10/06/2014   ALT 21 10/06/2014   AST 19 10/06/2014   NA 141 07/17/2013   K 4.4 07/17/2013   CL 105 07/17/2013   CREATININE 1.17 07/17/2013   BUN 15 07/17/2013   CO2 29 07/17/2013   INR 0.98 05/04/2011    BNP (last 3 results) No results for input(s): BNP in the last 8760 hours.  ProBNP (last 3 results) No results for input(s): PROBNP in the last 8760 hours.   Other Studies Reviewed Today:  Myoview Aug 21, 2011 Mild LVH, EF 50-55% Impaired LV relaxation Mild MR, mild TR  Echo 2011/08/21 Low Risk, fixed defect mid anterior wall, no ischemia, EF 59%  LHC 04/2011 LM: Normal LAD: Proximal 100% LCx: Ok RCA: Ok EF 30% with anteroapical AK PCI: 3.5 x 18 Vision BMS to LAD   CATH FROM  2012 SELECTIVE CORONARY ANGIOGRAPHY: 1. Left main normal. 2. LAD; LAD was a large vessel and was occluded proximally. 3. Left circumflex; this was nondominant and was free of significant  disease. 4. Right coronary artery; this was dominant and free of significant  disease. 5. Left ventriculography; RAO left ventriculogram was performed using  25 mL of Visipaque dye at 12 mL per second. The overall LVEF was  estimated at approximately 30% with anteroapical akinesia.  Assessment/Plan: Chest Pain: EKG today with 1 mm ST elevation - reviewed with Dr. Tamala Julian - not felt to meet criteria for  STEMI but still  very concerning. Active chest pain at this time and BP quite high. Admitting for cardiac cath. Transferred by EMS. The patient understands that risks include but are not limited to stroke (1 in 1000), death (1 in 89), kidney failure [usually temporary] (1 in 500), bleeding (1 in 200), allergic reaction [possibly serious] (1 in 200), and agrees to proceed. Will make NPO. He will need to be premedicated for dye allergy. NTG sl x 1 given here in the office - no real change but BP down to 140/100.   Coronary artery disease involving native coronary artery of native heart without angina pectoris: for cath later today  Ischemic cardiomyopathy: He had normalization of LVEF post PCI. Continue beta-blocker, angiotensin receptor blocker.   Essential hypertension: BP is quite high. Not at goal.   Hyperlipidemia: Continue statin. LDL at goal in 10/2014.   Current medicines are reviewed with the patient today.  The patient does not have concerns regarding medicines other than what has been noted above.  The following changes have been made:  See above.  Labs/ tests ordered today include:   No orders of the defined types were placed in this encounter.     Disposition:   Discussed with Dr. Tamala Julian (DOD) - will refer on for admission for cardiac cath later today. Repeat BP after  NTG was 140/100.   Patient is agreeable to this plan and will call if any problems develop in the interim.   Signed: Burtis Junes, RN, ANP-C 04/19/2015 12:30 PM  [facility] [address] [place], [place]  [address] Phone: [phone] Fax: [phone]

## 2015-04-19 NOTE — Interval H&P Note (Signed)
History and Physical Interval Note:  04/19/2015 5:20 PM  Devin Norris  has presented today for surgery, with the diagnosis of unstable angina.   The various methods of treatment have been discussed with the patient and family. After consideration of risks, benefits and other options for treatment, the patient has consented to  Procedure(s): Left Heart Cath and Coronary Angiography (N/A) +/- Percutaneous Coronary Intervention as a surgical intervention .  The patient's history has been reviewed, patient examined, no change in status, stable for surgery.  I have reviewed the patient's chart and labs.  Questions were answered to the patient's satisfaction.     [facility], [facility]  Cath Lab Visit (complete for each Cath Lab visit)  Clinical Evaluation Leading to the Procedure:   ACS: Yes.    Non-ACS:    Anginal Classification: CCS III  Anti-ischemic medical therapy: Minimal Therapy (1 class of medications)  Non-Invasive Test Results: No non-invasive testing performed  Prior CABG: No previous CABG     APPROPRIATENESS OF USE TIMI SCORE  Patient Information:  TIMI Score is 4  UA/NSTEMI and intermediate-risk features (e.g., TIMI score 3?4) for short-term risk of death or nonfatal MI  Revascularization of the presumed culprit artery   A (8)  Indication: 10; Score: 8   Azucena Dart, Leonie Green, M.D., M.S. Interventional Cardiologist   Pager # [phone]

## 2015-04-19 NOTE — Patient Instructions (Addendum)
We are admitting you to the hospital today for a cath.    Call the [place] office at [phone] if you have any questions, problems or concerns.

## 2015-04-19 NOTE — Progress Notes (Signed)
ANTICOAGULATION CONSULT NOTE - Initial Consult  Pharmacy Consult for heparin Indication: chest pain/ACS  Allergies  Allergen Reactions  . Contrast Media [Iodinated Diagnostic Agents]   . Ramipril Cough    Patient Measurements:   Heparin Dosing Weight: 88kg  Vital Signs: BP: 180/110 mmHg (06/13 1219) Pulse Rate: 64 (06/13 1219)  Labs: No results for input(s): HGB, HCT, PLT, APTT, LABPROT, INR, HEPARINUNFRC, CREATININE, CKTOTAL, CKMB, TROPONINI in the last 72 hours.  CrCl cannot be calculated (Patient has no serum creatinine result on file.).   Medical History: Past Medical History  Diagnosis Date  . STEMI (ST elevation myocardial infarction) 04/30/2011    anterior wall  . Family history of heart disease   . History of nuclear stress test 07/28/2011    bruce protocol, f/up MI; normal, without evidence of scar  . CAD (coronary artery disease)   . Hyperlipidemia    Assessment: 69 y.o. male who presents today for a work in visit. Seen for Dr. Gwenlyn Found. He has a hx of CAD s/p Anterior STEMI 6/12 treated with a BMS to the LAD, ICM with EF 30% at time of MI (full recovery to normal LVF with EF 50-55% by echo in 9/12), HTN, HL and hypothyroidism. Patient admitted directly from clinic to CCU. Plan is for likely cath this afternoon.  Orders received to start IV heparin, not on anticoagulation pta.   Goal of Therapy:  Heparin level 0.3-0.7 units/ml Monitor platelets by anticoagulation protocol: Yes   Plan:  Give 4000 units bolus x 1 Start heparin infusion at 1000 units/hr Check anti-Xa level in 6 hours and daily while on heparin Continue to monitor H&H and platelets  Follow up cath plans  Erin Hearing PharmD., BCPS Clinical Pharmacist Pager [phone] 04/19/2015 2:17 PM

## 2015-04-19 NOTE — Telephone Encounter (Signed)
Walked in.  Stated he is still having chest discomfort and wanted to know what he should do.  States pain- aching- radiates across chest now. Denies SOB or radiating pain into neck or arm. States over the week end was very fatigued, no energy. States pain is not stabbing more like an ache.  Has not taken any NTG. Has taken all of his medications this AM. Richardson Dopp started him on Hyzaar 100/12.5 mg QD on Thurs 6/9.  States he hasn't taken BP since starting medications.  Today BP in (L) 162/110 and (R) 168/110 HR 60.  EKG done showed NSR.  Cecille Rubin Gerhardt,NP/flex will see pt.  Moved his myoview up to tomorrow at 7:30 at [facility].

## 2015-04-19 NOTE — Discharge Summary (Signed)
Discharge Summary   Patient ID: Devin Norris,  MRN: [id number], DOB/AGE: [birth year] 69 y.o.  Admit date: 04/19/2015 Discharge date: 04/19/2015  Primary Care Provider: Pcp Not In System Primary Cardiologist: Gwenlyn Found  Discharge Diagnoses Active Problems:   Coronary artery disease   Hyperlipidemia   Essential hypertension   Unstable angina   Chest discomfort   Chest pain   Allergies Allergies  Allergen Reactions  . Contrast Media [Iodinated Diagnostic Agents]   . Ramipril Cough    Procedures  Conclusion    1. The left ventricular systolic function is normal. Normal LVEDP 2. Prox LAD to Mid LAD lesion, the previously placed bare metal stent was 20% stenosed. The lesion was previously treated with a bare metal stent greater than two years ago. 2012; Anterior STEMI 3. Ost 1st Sept lesion, 80% stenosed.  Angiographically minimal CAD with ~20% ISR of BMS in prox LAD. No lesion to explain Unstable Angina Symptoms.  Recommendations:  Standard post radial cath care with TR band removal.  Continue home blood pressure medications, but anticipate discharge once bed rest is over.    History of Present Illness  A 69 y.o. male with hx of CAD s/p Anterior STEMI 6/12 treated with a BMS to the LAD, ICM with EF 30% at time of MI (full recovery to normal LVF with EF 50-55% by echo in 9/12), HTN, HL and hypothyroidism.   Seen last week in the [facility]LEX for chest pain. He had been traveling to [place] last week. He developed substernal chest pressure while driving that persisted for about 2 days. He had no associated symptoms. It was not like his previous angina.He was scheduled for a nuclear stress test tomorrow but walked in today for  complaint of persisting mild chest pressure. He was seen today in clinic by Dr. Tamala Julian and Truitt Merle, NP 04/19/15. Describes it as an "aching" sensation/heaviness. Was more midsternal and now to the left of his chest. No real associated symptoms.  BP quite high. He has not been checking at home. EKG with non specific ST changes. He was transferred to hospital for cath.   Hospital Course  - He was placed on IV heparin and taken to cath lab. Cath showed normal LV EF of 55-64%; prox LAD to Mid LAD lesion, the previously placed bare metal stent was 20% stenosed; Ost 1st Sept lesion, 80% stenosed. Plan to continue BP medication and f/u with Dr. Gwenlyn Found.   Discharge Vitals Blood pressure 149/66, pulse 65, temperature 97.7 F (36.5 C), temperature source Oral, resp. rate 19, height 5\' 9"  (1.753 m), weight 194 lb 3.6 oz (88.1 kg), SpO2 100 %.  Filed Weights   04/19/15 1333  Weight: 194 lb 3.6 oz (88.1 kg)    Recent Labs  04/19/15 1445  WBC 8.5  NEUTROABS 7.4  HGB 16.0  HCT 45.3  MCV 89.2  PLT 973   Basic Metabolic Panel  Recent Labs  04/19/15 1445  NA 140  K 4.5  CL 105  CO2 29  GLUCOSE 103*  BUN 17  CREATININE 1.26*  CALCIUM 9.5  MG 2.1   Liver Function Tests  Recent Labs  04/19/15 1445  AST 18  ALT 20  ALKPHOS 69  BILITOT 0.8  PROT 6.7  ALBUMIN 4.1   Cardiac Enzymes  Recent Labs  04/19/15 1445  TROPONINI <0.03   Thyroid Function Tests  Recent Labs  04/19/15 1445  TSH 2.867    Disposition  Pt is being discharged home today in  good condition.  Follow-up Plans & Appointments  Follow-up Information    Follow up with [facility].   Why:  office will call you for f/u appointment.    Contact information:   [address] [place] [address]          Discharge Instructions    Diet - low sodium heart healthy    Complete by:  As directed      Increase activity slowly    Complete by:  As directed   No driving for 2. No lifting over 5 lbs for 1 week. No sexual activity for 1 week.  Keep procedure site clean & dry. If you notice increased pain, swelling, bleeding or pus, call/return!  You may shower, but no soaking baths/hot tubs/pools for 1 week.            F/u Labs/Studies: as schedule   Discharge Medications    Medication List    TAKE these medications        aspirin 81 MG tablet  Take 81 mg by mouth daily.     atenolol 50 MG tablet  Commonly known as:  TENORMIN  Take 50 mg by mouth daily.     fluticasone 50 MCG/ACT nasal spray  Commonly known as:  FLONASE  Place 2 sprays into the nose daily as needed for rhinitis.     ICAPS PO  Take 2 capsules by mouth daily.     KLONOPIN 0.5 MG tablet  Generic drug:  clonazePAM  Take 0.5 mg by mouth 2 (two) times daily as needed for anxiety.     levothyroxine 50 MCG tablet  Commonly known as:  SYNTHROID, LEVOTHROID  Take 50 mcg by mouth daily before breakfast.     losartan-hydrochlorothiazide 100-12.5 MG per tablet  Commonly known as:  HYZAAR  Take 1 tablet by mouth daily.     nitroGLYCERIN 0.4 MG SL tablet  Commonly known as:  NITROSTAT  Place 0.4 mg under the tongue every 5 (five) minutes as needed for chest pain (as needed for chest pain).     rosuvastatin 40 MG tablet  Commonly known as:  CRESTOR  Take 1 tablet (40 mg total) by mouth daily.        Duration of Discharge Encounter   Greater than 30 minutes including physician time.  Signed, Zenaida Tesar PA-C 04/19/2015, 8:00 PM

## 2015-04-19 NOTE — Discharge Instructions (Signed)
No driving for 2 days. No lifting over 5 lbs for 1 week. No sexual activity for 1 week. Keep procedure site clean & dry. If you notice increased pain, swelling, bleeding or pus, call/return!  You may shower, but no soaking baths/hot tubs/pools for 1 week.   

## 2015-04-19 NOTE — H&P (Signed)
Devin Norris  04/19/2015 2:30 PM  Office Visit  MRN:  [id number]   Description: Male DOB: 1946-08-07  Provider: Burtis Junes, NP  Department: [facility]       Vital Signs  Most recent update: 04/19/2015 12:22 PM by Tamsen Snider    BP Pulse Ht Wt BMI SpO2    180/110 mmHg 64 5\' 9"  (1.753 m) 198 lb 12.8 oz (90.175 kg) 29.34 kg/m2 98%    Vitals History     Progress Notes      Burtis Junes, NP at 04/19/2015 12:13 PM     Status: Signed       Expand All Collapse All       CARDIOLOGY OFFICE NOTE  Date: 04/19/2015    [name] Date of Birth: 1946-02-07 Medical Record #[id number]  PCP: Pcp Not In System Cardiologist: Gwenlyn Found  Chief Complaint  Patient presents with  . Chest Pain    Work in visit - patient walked in - seen for Dr. Gwenlyn Found    History of Present Illness: Devin Norris is a 69 y.o. male who presents today for a work in visit. Seen for Dr. Gwenlyn Found. He has a hx of CAD s/p Anterior STEMI 6/12 treated with a BMS to the LAD, ICM with EF 30% at time of MI (full recovery to normal LVF with EF 50-55% by echo in 9/12), HTN, HL and hypothyroidism.   Last seen by Dr. Quay Burow 10/2014.   Seen last week in the [facility] for chest pain. He had been traveling to [place] last week. He developed substernal chest pressure while driving that persisted for about 2 days. He had no associated symptoms. It was not like his previous angina. He remains quite active.Myoview was ordered.  Walks in today. Here alone. More chest pain. Thus added to the FLEX again.  Comes in today. Here alone. He notes that he walked back he because he is "concerned". His discomfort has continued over the course of the weekend. Never really goes away. He has limited his activities over the weekend. Has not been in the heat. Describes it as an "aching" sensation/heaviness. Was more midsternal and now to the left of his chest. No real associated  symptoms. BP quite high. He has not been checking at home. This still feels different from his prior presentation.   Past Medical History  Diagnosis Date  . STEMI (ST elevation myocardial infarction) 04/30/2011    anterior wall  . Family history of heart disease   . History of nuclear stress test 07/28/2011    bruce protocol, f/up MI; normal, without evidence of scar  . CAD (coronary artery disease)   . Hyperlipidemia     Past Surgical History  Procedure Laterality Date  . Coronary angioplasty with stent placement  04/30/2011    r/t anterior wall STEMI; 3.5x20mm Multi-Link Vision bare metal stent to LAD  . Transthoracic echocardiogram  07/28/2011    EF 50-55%, mild conc LVH; mild MR/TR; mild calcif of aortic valve leaflets     Medications: Current Outpatient Prescriptions  Medication Sig Dispense Refill  . aspirin 81 MG tablet Take 81 mg by mouth daily.    [name] atenolol (TENORMIN) 50 MG tablet Take 50 mg by mouth daily.    . clonazePAM (KLONOPIN) 0.5 MG tablet Take 0.5 mg by mouth 2 (two) times daily as needed for anxiety.    . fluticasone (FLONASE) 50 MCG/ACT nasal spray Place 2 sprays into the nose daily  as needed for rhinitis.    [name] levothyroxine (SYNTHROID, LEVOTHROID) 50 MCG tablet Take 50 mcg by mouth daily before breakfast.    . losartan-hydrochlorothiazide (HYZAAR) 100-12.5 MG per tablet Take 1 tablet by mouth daily. 30 tablet 11  . Multiple Vitamins-Minerals (ICAPS PO) Take 2 capsules by mouth daily.    . nitroGLYCERIN (NITROSTAT) 0.4 MG SL tablet Place 0.4 mg under the tongue every 5 (five) minutes as needed for chest pain (as needed for chest pain).    . rosuvastatin (CRESTOR) 40 MG tablet Take 1 tablet (40 mg total) by mouth daily. 30 tablet 11   No current facility-administered medications for this visit.    Allergies: Allergies  Allergen Reactions  . Contrast Media  [Iodinated Diagnostic Agents]   . Ramipril Cough    Social History: The patient  reports that he has never smoked. He does not have any smokeless tobacco history on file.  Family History: The patient's family history includes Heart attack in his brother and father; Heart disease in his brother, father, and mother; Hypertension in his brother and father; Lung cancer in his father; Stroke in his paternal grandfather.   Review of Systems: Please see the history of present illness. Otherwise, the review of systems is positive for none. All other systems are reviewed and negative.   Physical Exam: VS: BP 180/110 mmHg  Pulse 64  Ht 5\' 9"  (1.753 m)  Wt 198 lb 12.8 oz (90.175 kg)  BMI 29.34 kg/m2  SpO2 98% . BMI Body mass index is 29.34 kg/(m^2).  Wt Readings from Last 3 Encounters:  04/19/15 198 lb 12.8 oz (90.175 kg)  04/15/15 199 lb (90.266 kg)  10/06/14 203 lb (92.08 kg)    General: Pleasant. Well developed, well nourished and in no acute distress.  HEENT: Normal.  Neck: Supple, no JVD, carotid bruits, or masses noted.  Cardiac: Regular rate and rhythm. No murmurs, rubs, or gallops. No edema.  Respiratory: Lungs are clear to auscultation bilaterally with normal work of breathing.  GI: Soft and nontender.  MS: No deformity or atrophy. Gait and ROM intact.  Skin: Warm and dry. Color is normal.  Neuro: Strength and sensation are intact and no gross focal deficits noted.  Psych: Alert, appropriate and with normal affect.   LABORATORY DATA:  EKG: EKG is ordered today. This demonstrates sinus bradycardia with 1 mm ST elevation noted - reviewed with Dr. Tamala Julian (DOD).   Recent Labs    Lab Results  Component Value Date   WBC 9.2 05/04/2011   HGB 15.9 05/04/2011   HCT 44.2 05/04/2011   PLT 126* 05/04/2011   GLUCOSE 88 07/17/2013   CHOL 128 10/06/2014   TRIG 67 10/06/2014   HDL 48 10/06/2014   LDLCALC 67  10/06/2014   ALT 21 10/06/2014   AST 19 10/06/2014   NA 141 07/17/2013   K 4.4 07/17/2013   CL 105 07/17/2013   CREATININE 1.17 07/17/2013   BUN 15 07/17/2013   CO2 29 07/17/2013   INR 0.98 05/04/2011      BNP (last 3 results)  Recent Labs (within last 365 days)    No results for input(s): BNP in the last 8760 hours.    ProBNP (last 3 results)  Recent Labs (within last 365 days)    No results for input(s): PROBNP in the last 8760 hours.     Other Studies Reviewed Today:  Myoview 09-04-2011 Mild LVH, EF 50-55% Impaired LV relaxation Mild MR, mild TR  Echo 04-Sep-2011  Low Risk, fixed defect mid anterior wall, no ischemia, EF 59%  LHC 04/2011 LM: Normal LAD: Proximal 100% LCx: Ok RCA: Ok EF 30% with anteroapical AK PCI: 3.5 x 18 Vision BMS to LAD   CATH FROM 2012 SELECTIVE CORONARY ANGIOGRAPHY: 1. Left main normal. 2. LAD; LAD was a large vessel and was occluded proximally. 3. Left circumflex; this was nondominant and was free of significant  disease. 4. Right coronary artery; this was dominant and free of significant  disease. 5. Left ventriculography; RAO left ventriculogram was performed using  25 mL of Visipaque dye at 12 mL per second. The overall LVEF was  estimated at approximately 30% with anteroapical akinesia.  Assessment/Plan: Chest Pain: EKG today with 1 mm ST elevation - reviewed with Dr. Tamala Julian - not felt to meet criteria for STEMI but still very concerning. Active chest pain at this time and BP quite high. Admitting for cardiac cath. Transferred by EMS. The patient understands that risks include but are not limited to stroke (1 in 1000), death (1 in 18), kidney failure [usually temporary] (1 in 500), bleeding (1 in 200), allergic reaction [possibly serious] (1 in 200), and agrees to proceed. Will make NPO. He will need to be premedicated for dye allergy. NTG sl x 1 given here in the office - no real  change but BP down to 140/100.   Coronary artery disease involving native coronary artery of native heart without angina pectoris: for cath later today  Ischemic cardiomyopathy: He had normalization of LVEF post PCI. Continue beta-blocker, angiotensin receptor blocker.   Essential hypertension: BP is quite high. Not at goal. HCTZ is increased to 25 mg.   Hyperlipidemia: Continue statin. LDL at goal in 10/2014.   Current medicines are reviewed with the patient today. The patient does not have concerns regarding medicines other than what has been noted above.  The following changes have been made: See above.  Labs/ tests ordered today include:   No orders of the defined types were placed in this encounter.    Disposition: Discussed with Dr. Tamala Julian (DOD) - will refer on for admission for cardiac cath later today. Repeat BP after NTG was 140/100.   Patient is agreeable to this plan and will call if any problems develop in the interim.   Signed: Burtis Junes, RN, ANP-C 04/19/2015 12:30 PM  [facility] [address] [place], [place] [address] Phone: [phone] Fax: [phone]                   Diagnoses     Ischemic cardiomyopathy - Primary    ICD-9-CM: 414.8 ICD-10-CM: I25.5    Hyperlipidemia     ICD-9-CM: 272.4 ICD-10-CM: E78.5    Chest pain, unspecified chest pain type     ICD-9-CM: 786.50 ICD-10-CM: R07.9       Reason for Visit     Chest Pain    Work in visit - patient walked in - seen for Dr. Gwenlyn Found    Reason for Visit History        Discontinued Medications       Reason for Discontinue    Omega-3 Fatty Acids ([place]endrick) Change in therapy      Level of Service     LOS - NO CHARGE [NC1]   LOS History      Follow-up and Disposition     Routing History       All Charges for This Encounter  Code Description Service Date Service  Provider Modifiers Qty    93000 PR ELECTROCARDIOGRAM, COMPLETE 04/19/2015 Burtis Junes, NP  1    NC1 LOS - NO CHARGE 04/19/2015 Burtis Junes, NP  1      AVS Reports     No AVS Snapshots are available for this encounter.     Patient Instructions     We are admitting you to the hospital today for a cath.    Call the [place] office at [phone] if you have any questions, problems or concerns.           Patient Instructions History      Routing History     There are no sent or routed communications associated with this encounter.     Chart Reviewed By     Lorretta Harp, MD on 04/19/2015 1:12 PM     Previous Visit       Provider Department Encounter #    04/19/2015 12:14 PM Truitt Merle, NP [facility] [id number]

## 2015-04-19 NOTE — Progress Notes (Signed)
Pt with order for discharge tonight. Discharge papers given & instructed. Instructed about radial site care. No complications noted, no chest pain or no bleeding to the radial site area. V/S stable. IV removed. Pt walking the hall without complications. Discharged pt at 23:15 with family.

## 2015-04-20 ENCOUNTER — Telehealth: Payer: Self-pay | Admitting: Cardiovascular Disease

## 2015-04-20 ENCOUNTER — Encounter (HOSPITAL_COMMUNITY): Payer: BLUE CROSS/BLUE SHIELD

## 2015-04-20 ENCOUNTER — Encounter (HOSPITAL_COMMUNITY): Payer: Self-pay | Admitting: Cardiology

## 2015-04-20 LAB — HEMOGLOBIN A1C
Hgb A1c MFr Bld: 5.5 % (ref 4.8–5.6)
Mean Plasma Glucose: 111 mg/dL

## 2015-04-20 MED FILL — Lidocaine HCl Local Preservative Free (PF) Inj 1%: INTRAMUSCULAR | Qty: 30 | Status: AC

## 2015-04-20 MED FILL — Heparin Sodium (Porcine) 2 Unit/ML in Sodium Chloride 0.9%: INTRAMUSCULAR | Qty: 1000 | Status: AC

## 2015-04-20 NOTE — Telephone Encounter (Signed)
Needs a d/c phone call .[name] appt is on 05/25/15 at 8:30am

## 2015-04-26 ENCOUNTER — Other Ambulatory Visit: Payer: BLUE CROSS/BLUE SHIELD

## 2015-04-30 ENCOUNTER — Encounter (HOSPITAL_COMMUNITY): Payer: BLUE CROSS/BLUE SHIELD

## 2015-04-30 MED ORDER — NITROGLYCERIN 0.4 MG SL SUBL: 0.4000 mg | SUBLINGUAL_TABLET | SUBLINGUAL | Status: DC | PRN

## 2015-04-30 NOTE — Telephone Encounter (Signed)
Patient contacted regarding discharge from [facility] on June 13.  Patient understands to follow up with provider Dr. Gwenlyn Found on July 19 at [place] at [facility]. Patient understands discharge instructions? YES Patient understands medications and regiment? YES - needed NTG refill - Rx(s) sent to pharmacy electronically. Patient understands to bring all medications to this visit? YES

## 2015-05-11 ENCOUNTER — Ambulatory Visit (INDEPENDENT_AMBULATORY_CARE_PROVIDER_SITE_OTHER): Payer: BLUE CROSS/BLUE SHIELD | Admitting: Cardiovascular Disease

## 2015-05-11 ENCOUNTER — Encounter: Payer: Self-pay | Admitting: Cardiovascular Disease

## 2015-05-11 VITALS — BP 148/82 | HR 72 | Ht 69.0 in | Wt 197.0 lb

## 2015-05-11 DIAGNOSIS — I2510 Atherosclerotic heart disease of native coronary artery without angina pectoris: Secondary | ICD-10-CM

## 2015-05-11 DIAGNOSIS — I2583 Coronary atherosclerosis due to lipid rich plaque: Secondary | ICD-10-CM

## 2015-05-11 DIAGNOSIS — E785 Hyperlipidemia, unspecified: Secondary | ICD-10-CM

## 2015-05-11 DIAGNOSIS — I10 Essential (primary) hypertension: Secondary | ICD-10-CM | POA: Diagnosis not present

## 2015-05-11 NOTE — Patient Instructions (Signed)
Your physician recommends that you schedule a follow-up appointment in 6 months with an extender. Dr Gwenlyn Found recommends that you schedule a follow-up appointment in 1 year. You will receive a reminder letter in the mail two months in advance. If you don't receive a letter, please call our office to schedule the follow-up appointment.

## 2015-05-11 NOTE — Assessment & Plan Note (Signed)
History of hypertension blood pressure measured at 140/82. He is on atenolol, losartan and hydrochlorothiazide. Continue current meds at current dosing

## 2015-05-11 NOTE — Assessment & Plan Note (Signed)
History of hyperlipidemia on Crestor 40 mg a day with recent lipid profile performed 10/06/14 revealed a total cholesterol 120, LDL 67 and HDL of 48

## 2015-05-11 NOTE — Assessment & Plan Note (Signed)
History of coronary artery disease status post inferior STEMI 04/30/11 at 4:00 in the afternoon where I performed chronic catheterization revealing an occluded proximal LAD which I stented using a 3.5 x 18 mm long Multi-Link vision bare metal stent. His EF at that time was 30% with anteroapical akinesia with a peak CPK MB of 70 and troponin of 21. The remainder of his epicardial coronary arteries were normal. A follow-up echocardiogram and Myoview performed several months later revealed normal LV systolic function with complete salvage of his myocardium and no evidence of scar. He recently had chest discomfort and accelerated hypertension last month and was seen by Dr. Tamala Julian and Remer Macho registered nurse practitioner on 04/19/15 which time he was directly admitted and underwent radial artery catheterization by Dr. Ellyn Hack revealing a widely patent stent with the big and CAD and normal LV function. He has had no further symptoms.

## 2015-05-11 NOTE — Progress Notes (Signed)
05/11/2015 ROWAN BLAKER   [birth year]  [id number]  Primary Physician London Pepper, MD Primary Cardiologist: Lorretta Harp MD Renae Gloss   HPI:  The patient is a very pleasant 69 -year-old mildly overweight divorced Caucasian male, father of 57, grandfather to 2 grandchildren, who I last saw in the office 65months ago. He works as an Buyer, retail. He is fairly active and still plays tennis. His risk factors include a strong family history for heart disease. He suffered an anterior STEMI, April 30, 2011, at 4 o'clock in the afternoon, with an occluded proximal LAD, which I stented using a 3.5 x 18 mm long Multi-Link Vision bare-metal stent. His EF was 30% at that time with anteroapical akinesia. Peak CPK MB was 70 with a troponin of 21. His other epicardial arteries were normal. A followup echocardiogram and Myoview performed several months later revealed normal LV systolic function with complete salvage of myocardium and no evidence of scar. He had recurrent chest pain last month and underwent radial crit catheterization on 04/19/15 by Dr. Ellyn Hack revealing a widely patent stent with no significant CAD and normal LV function. He has had no recurrent symptoms. Recent lipid profile performed 10/06/14 revealed total cholesterol 128, LDL 67 and HDL of 48.   Current Outpatient Prescriptions  Medication Sig Dispense Refill  . aspirin 81 MG tablet Take 81 mg by mouth daily.    [name] atenolol (TENORMIN) 50 MG tablet Take 50 mg by mouth daily.    . clonazePAM (KLONOPIN) 0.5 MG tablet Take 0.5 mg by mouth 2 (two) times daily as needed for anxiety.    . fluticasone (FLONASE) 50 MCG/ACT nasal spray Place 2 sprays into the nose daily as needed for rhinitis.    [name] levothyroxine (SYNTHROID, LEVOTHROID) 50 MCG tablet Take 50 mcg by mouth daily before breakfast.    . losartan-hydrochlorothiazide (HYZAAR) 100-12.5 MG per tablet Take 1 tablet by mouth daily. 30 tablet 11  . Multiple  Vitamins-Minerals (ICAPS PO) Take 2 capsules by mouth daily.    . nitroGLYCERIN (NITROSTAT) 0.4 MG SL tablet Place 1 tablet (0.4 mg total) under the tongue every 5 (five) minutes as needed for chest pain (as needed for chest pain). 25 tablet 3  . rosuvastatin (CRESTOR) 40 MG tablet Take 1 tablet (40 mg total) by mouth daily. 30 tablet 11   No current facility-administered medications for this visit.    Allergies  Allergen Reactions  . Contrast Media [Iodinated Diagnostic Agents]   . Ramipril Cough    History   Social History  . Marital Status: Divorced    Spouse Name: N/A  . Number of Children: 2  . Years of Education: N/A   Occupational History  . Buyer, retail    Social History Main Topics  . Smoking status: Never Smoker   . Smokeless tobacco: Not on file  . Alcohol Use: Not on file  . Drug Use: Not on file  . Sexual Activity: Not on file   Other Topics Concern  . Not on file   Social History Narrative     Review of Systems: General: negative for chills, fever, night sweats or weight changes.  Cardiovascular: negative for chest pain, dyspnea on exertion, edema, orthopnea, palpitations, paroxysmal nocturnal dyspnea or shortness of breath Dermatological: negative for rash Respiratory: negative for cough or wheezing Urologic: negative for hematuria Abdominal: negative for nausea, vomiting, diarrhea, bright red blood per rectum, melena, or hematemesis Neurologic: negative for visual changes, syncope, or dizziness All  other systems reviewed and are otherwise negative except as noted above.    Blood pressure 148/82, pulse 72, height 5\' 9"  (1.753 m), weight 197 lb (89.359 kg).  General appearance: alert and no distress Neck: no adenopathy, no carotid bruit, no JVD, supple, symmetrical, trachea midline and thyroid not enlarged, symmetric, no tenderness/mass/nodules Lungs: clear to auscultation bilaterally Heart: regular rate and rhythm, S1, S2 normal, no murmur,  click, rub or gallop Extremities: extremities normal, atraumatic, no cyanosis or edema and his right radial arterial puncture site is well-healed  EKG normal sinus rhythm at 75 without ST or T-wave changes. I personally reviewed this EKG  ASSESSMENT AND PLAN:   Hyperlipidemia History of hyperlipidemia on Crestor 40 mg a day with recent lipid profile performed 10/06/14 revealed a total cholesterol 120, LDL 67 and HDL of 48  Essential hypertension History of hypertension blood pressure measured at 140/82. He is on atenolol, losartan and hydrochlorothiazide. Continue current meds at current dosing  Coronary artery disease History of coronary artery disease status post inferior STEMI 04/30/11 at 4:00 in the afternoon where I performed chronic catheterization revealing an occluded proximal LAD which I stented using a 3.5 x 18 mm long Multi-Link vision bare metal stent. His EF at that time was 30% with anteroapical akinesia with a peak CPK MB of 70 and troponin of 21. The remainder of his epicardial coronary arteries were normal. A follow-up echocardiogram and Myoview performed several months later revealed normal LV systolic function with complete salvage of his myocardium and no evidence of scar. He recently had chest discomfort and accelerated hypertension last month and was seen by Dr. Tamala Julian and Remer Macho registered nurse practitioner on 04/19/15 which time he was directly admitted and underwent radial artery catheterization by Dr. Ellyn Hack revealing a widely patent stent with the big and CAD and normal LV function. He has had no further symptoms.      Lorretta Harp MD FACP,FACC,FAHA, [facility] 05/11/2015 9:35 AM

## 2015-05-14 ENCOUNTER — Other Ambulatory Visit: Payer: Self-pay | Admitting: Family Medicine

## 2015-05-14 DIAGNOSIS — R109 Unspecified abdominal pain: Secondary | ICD-10-CM

## 2015-05-18 ENCOUNTER — Ambulatory Visit
Admission: RE | Admit: 2015-05-18 | Discharge: 2015-05-18 | Disposition: A | Discharge status: Home / Self Care | Payer: Medicare Other | Source: Ambulatory Visit | Attending: Family Medicine | Admitting: Family Medicine

## 2015-05-18 DIAGNOSIS — R109 Unspecified abdominal pain: Secondary | ICD-10-CM

## 2015-05-25 ENCOUNTER — Ambulatory Visit: Payer: BLUE CROSS/BLUE SHIELD | Admitting: Cardiovascular Disease

## 2015-08-23 ENCOUNTER — Other Ambulatory Visit: Payer: Self-pay | Admitting: Family Medicine

## 2015-08-23 ENCOUNTER — Ambulatory Visit
Admission: RE | Admit: 2015-08-23 | Discharge: 2015-08-23 | Disposition: A | Discharge status: Home / Self Care | Payer: Medicare Other | Source: Ambulatory Visit | Attending: Family Medicine | Admitting: Family Medicine

## 2015-08-23 DIAGNOSIS — R109 Unspecified abdominal pain: Secondary | ICD-10-CM

## 2015-09-22 ENCOUNTER — Other Ambulatory Visit: Payer: Self-pay | Admitting: Urology

## 2015-10-15 ENCOUNTER — Encounter (HOSPITAL_COMMUNITY): Payer: Self-pay | Admitting: *Deleted

## 2015-10-21 ENCOUNTER — Encounter (HOSPITAL_COMMUNITY)
Admission: RE | Disposition: A | Discharge status: Home / Self Care | Payer: Self-pay | Source: Ambulatory Visit | Attending: Urology

## 2015-10-21 ENCOUNTER — Ambulatory Visit (HOSPITAL_COMMUNITY): Payer: Medicare Other

## 2015-10-21 ENCOUNTER — Ambulatory Visit (HOSPITAL_COMMUNITY)
Admission: RE | Admit: 2015-10-21 | Discharge: 2015-10-21 | Disposition: A | Discharge status: Home / Self Care | Payer: Medicare Other | Source: Ambulatory Visit | Attending: Urology | Admitting: Urology

## 2015-10-21 ENCOUNTER — Encounter (HOSPITAL_COMMUNITY): Payer: Self-pay | Admitting: *Deleted

## 2015-10-21 DIAGNOSIS — E039 Hypothyroidism, unspecified: Secondary | ICD-10-CM | POA: Diagnosis not present

## 2015-10-21 DIAGNOSIS — E785 Hyperlipidemia, unspecified: Secondary | ICD-10-CM | POA: Insufficient documentation

## 2015-10-21 DIAGNOSIS — Z7951 Long term (current) use of inhaled steroids: Secondary | ICD-10-CM | POA: Insufficient documentation

## 2015-10-21 DIAGNOSIS — I2510 Atherosclerotic heart disease of native coronary artery without angina pectoris: Secondary | ICD-10-CM | POA: Diagnosis not present

## 2015-10-21 DIAGNOSIS — I252 Old myocardial infarction: Secondary | ICD-10-CM | POA: Diagnosis not present

## 2015-10-21 DIAGNOSIS — Z79899 Other long term (current) drug therapy: Secondary | ICD-10-CM | POA: Insufficient documentation

## 2015-10-21 DIAGNOSIS — Z955 Presence of coronary angioplasty implant and graft: Secondary | ICD-10-CM | POA: Diagnosis not present

## 2015-10-21 DIAGNOSIS — Z7982 Long term (current) use of aspirin: Secondary | ICD-10-CM | POA: Insufficient documentation

## 2015-10-21 DIAGNOSIS — Z87442 Personal history of urinary calculi: Secondary | ICD-10-CM | POA: Diagnosis not present

## 2015-10-21 DIAGNOSIS — N200 Calculus of kidney: Secondary | ICD-10-CM | POA: Insufficient documentation

## 2015-10-21 DIAGNOSIS — R109 Unspecified abdominal pain: Secondary | ICD-10-CM | POA: Diagnosis present

## 2015-10-21 HISTORY — DX: Calculus of kidney: N20.0

## 2015-10-21 HISTORY — DX: Hypothyroidism, unspecified: E03.9

## 2015-10-21 SURGERY — LITHOTRIPSY, ESWL
Anesthesia: LOCAL | Laterality: Left

## 2015-10-21 MED ORDER — DIPHENHYDRAMINE HCL 25 MG PO CAPS
25.0000 mg | ORAL_CAPSULE | ORAL | Status: AC
  Administered 2015-10-21: 25 mg via ORAL
  Filled 2015-10-21: qty 1

## 2015-10-21 MED ORDER — CIPROFLOXACIN HCL 500 MG PO TABS
500.0000 mg | ORAL_TABLET | ORAL | Status: AC
  Administered 2015-10-21: 500 mg via ORAL
  Filled 2015-10-21: qty 1

## 2015-10-21 MED ORDER — DIAZEPAM 5 MG PO TABS
10.0000 mg | ORAL_TABLET | ORAL | Status: AC
  Administered 2015-10-21: 10 mg via ORAL
  Filled 2015-10-21: qty 2

## 2015-10-21 MED ORDER — SODIUM CHLORIDE 0.9 % IV SOLN
INTRAVENOUS | Status: DC
  Administered 2015-10-21: 09:00:00 via INTRAVENOUS

## 2015-10-21 NOTE — Discharge Instructions (Signed)
Lithotripsy, Care After °Refer to this sheet in the next few weeks. These instructions provide you with information on caring for yourself after your procedure. Your health care provider may also give you more specific instructions. Your treatment has been planned according to current medical practices, but problems sometimes occur. Call your health care provider if you have any problems or questions after your procedure. °WHAT TO EXPECT AFTER THE PROCEDURE  °· Your urine may have a red tinge for a few days after treatment. Blood loss is usually minimal. °· You may have soreness in the back or flank area. This usually goes away after a few days. The procedure can cause blotches or bruises on the back where the pressure wave enters the skin. These marks usually cause only minimal discomfort and should disappear in a short time. °· Stone fragments should begin to pass within 24 hours of treatment. However, a delayed passage is not unusual. °· You may have pain, discomfort, and feel sick to your stomach (nauseated) when the crushed fragments of stone are passed down the tube from the kidney to the bladder. Stone fragments can pass soon after the procedure and may last for up to 4-8 weeks. °· A small number of patients may have severe pain when stone fragments are not able to pass, which leads to an obstruction. °· If your stone is greater than 1 inch (2.5 cm) in diameter or if you have multiple stones that have a combined diameter greater than 1 inch (2.5 cm), you may require more than one treatment. °· If you had a stent placed prior to your procedure, you may experience some discomfort, especially during urination. You may experience the pain or discomfort in your flank or back, or you may experience a sharp pain or discomfort at the base of your penis or in your lower abdomen. The discomfort usually lasts only a few minutes after urinating. °HOME CARE INSTRUCTIONS  °· Rest at home until you feel your energy  improving. °· Only take over-the-counter or prescription medicines for pain, discomfort, or fever as directed by your health care provider. Depending on the type of lithotripsy, you may need to take antibiotics and anti-inflammatory medicines for a few days. °· Drink enough water and fluids to keep your urine clear or pale yellow. This helps "flush" your kidneys. It helps pass any remaining pieces of stone and prevents stones from coming back. °· Most people can resume daily activities within 1-2 days after standard lithotripsy. It can take longer to recover from laser and percutaneous lithotripsy. °· Strain all urine through the provided strainer. Keep all particulate matter and stones for your health care provider to see. The stone may be as small as a grain of salt. It is very important to use the strainer each and every time you pass your urine. Any stones that are found can be sent to a medical lab for examination. °· Visit your health care provider for a follow-up appointment in a few weeks. Your doctor may remove your stent if you have one. Your health care provider will also check to see whether stone particles still remain. °SEEK MEDICAL CARE IF:  °· Your pain is not relieved by medicine. °· You have a lasting nauseous feeling. °· You feel there is too much blood in the urine. °· You develop persistent problems with frequent or painful urination that does not at least partially improve after 2 days following the procedure. °· You have a congested cough. °· You feel   lightheaded. °· You develop a rash or any other signs that might suggest an allergic problem. °· You develop any reaction or side effects to your medicine(s). °SEEK IMMEDIATE MEDICAL CARE IF:  °· You experience severe back or flank pain or both. °· You see nothing but blood when you urinate. °· You cannot pass any urine at all. °· You have a fever or shaking chills. °· You develop shortness of breath, difficulty breathing, or chest pain. °· You  develop vomiting that will not stop after 6-8 hours. °· You have a fainting episode. °  °This information is not intended to replace advice given to you by your health care provider. Make sure you discuss any questions you have with your health care provider. °  °Document Released: 11/12/2007 Document Revised: 07/14/2015 Document Reviewed: 05/08/2013 °Elsevier Interactive Patient Education ©2016 [employer] ° °

## 2015-10-21 NOTE — H&P (Signed)
Urology Admission H&P  Chief Complaint: left flank pain  History of Present Illness:  Devin Norris is a 69yo here today for L ESWL. He had his first stone event in 08/2015 when he passed a 80mm right ureteral calculus. He had a 4-27mm Left upper pole renal calculus. He has mild intermittent left flank pain. He denies any LUTS   Past Medical History  Diagnosis Date  . STEMI (ST elevation myocardial infarction) ([place]) 04/30/2011    anterior wall  . Family history of heart disease   . History of nuclear stress test 07/28/2011    bruce protocol, f/up MI; normal, without evidence of scar  . CAD (coronary artery disease)   . Hyperlipidemia   . Hypothyroidism   . Renal stones     passed stone   Past Surgical History  Procedure Laterality Date  . Coronary angioplasty with stent placement  04/30/2011    r/t anterior wall STEMI; 3.5x3mm Multi-Link Vision bare metal stent to LAD  . Transthoracic echocardiogram  07/28/2011    EF 50-55%, mild conc LVH; mild Devin/TR; mild calcif of aortic valve leaflets  . Cardiac catheterization N/A 04/19/2015    Procedure: Left Heart Cath and Coronary Angiography;  Surgeon: Leonie Man, MD;  Location: [facility];  Service: Cardiovascular;  Laterality: N/A;  . Nasal septum surgery      30 years ago    Home Medications:  Prescriptions prior to admission  Medication Sig Dispense Refill Last Dose  . atenolol (TENORMIN) 50 MG tablet Take 50 mg by mouth daily.   10/21/2015 at 0700  . clonazePAM (KLONOPIN) 0.5 MG tablet Take 0.5 mg by mouth 2 (two) times daily as needed for anxiety.   Past Week at Unknown time  . fluticasone (FLONASE) 50 MCG/ACT nasal spray Place 2 sprays into the nose daily as needed for rhinitis.   Past Month at Unknown time  . levothyroxine (SYNTHROID, LEVOTHROID) 50 MCG tablet Take 50 mcg by mouth daily before breakfast.   10/20/2015 at 1300  . losartan-hydrochlorothiazide (HYZAAR) 100-12.5 MG per tablet Take 1 tablet by mouth daily. 30  tablet 11 10/20/2015 at 1300  . Multiple Vitamins-Minerals (ICAPS LUTEIN & ZEAXANTHIN PO) Take by mouth 2 (two) times daily.   10/17/2015  . rosuvastatin (CRESTOR) 40 MG tablet Take 1 tablet (40 mg total) by mouth daily. 30 tablet 11 10/20/2015 at 1300  . aspirin 81 MG tablet Take 81 mg by mouth daily.   10/17/2015 at 1500  . Multiple Vitamins-Minerals (ICAPS PO) Take 2 capsules by mouth daily.   Taking  . nitroGLYCERIN (NITROSTAT) 0.4 MG SL tablet Place 1 tablet (0.4 mg total) under the tongue every 5 (five) minutes as needed for chest pain (as needed for chest pain). 25 tablet 3 Unknown at Unknown time   Allergies:  Allergies  Allergen Reactions  . Contrast Media [Iodinated Diagnostic Agents]     Sob, tightness in throat  . Ramipril Cough    Family History  Problem Relation Age of Onset  . Heart disease Mother   . Heart disease Father   . Lung cancer Father   . Heart disease Brother   . Heart attack Father   . Heart attack Brother   . Hypertension Father   . Hypertension Brother   . Stroke Paternal Grandfather    Social History:  reports that he has never smoked. He does not have any smokeless tobacco history on file. He reports that he drinks alcohol. He reports that he  does not use illicit drugs.  Review of Systems  All other systems reviewed and are negative.   Physical Exam:  Vital signs in last 24 hours: Temp:  [97.8 F (36.6 C)] 97.8 F (36.6 C) (12/15 0756) Pulse Rate:  [63] 63 (12/15 0756) Resp:  [18] 18 (12/15 0756) BP: (133)/(82) 133/82 mmHg (12/15 0756) SpO2:  [98 %] 98 % (12/15 0756) Weight:  [87.261 kg (192 lb 6 oz)] 87.261 kg (192 lb 6 oz) (12/15 0756) Physical Exam  Constitutional: He is oriented to person, place, and time. He appears well-developed and well-nourished.  HENT:  Head: Normocephalic and atraumatic.  Eyes: EOM are normal. Pupils are equal, round, and reactive to light.  Neck: Normal range of motion. No thyromegaly present.   Cardiovascular: Normal rate and regular rhythm.   Respiratory: Effort normal. No respiratory distress.  GI: Soft. He exhibits no distension.  Musculoskeletal: Normal range of motion.  Neurological: He is alert and oriented to person, place, and time.  Skin: Skin is warm and dry.  Psychiatric: He has a normal mood and affect. His behavior is normal. Judgment and thought content normal.    Laboratory Data:  No results found for this or any previous visit (from the past 24 hour(s)). No results found for this or any previous visit (from the past 240 hour(s)). Creatinine: No results for input(s): CREATININE in the last 168 hours. Baseline Creatinine: unknown  Impression/Assessment:  69yo with L Renal calculus  Plan:  The risks/benefits/alternatives to L ESWL was explained to the patient and he understands and wishes to proceed with surgery  Devin Norris L 10/21/2015, 10:15 AM

## 2015-12-02 ENCOUNTER — Other Ambulatory Visit: Payer: Self-pay | Admitting: Cardiovascular Disease

## 2015-12-03 NOTE — Telephone Encounter (Signed)
REFILL 

## 2016-01-05 ENCOUNTER — Other Ambulatory Visit: Payer: Self-pay | Admitting: *Deleted

## 2016-01-05 DIAGNOSIS — I10 Essential (primary) hypertension: Secondary | ICD-10-CM

## 2016-01-05 DIAGNOSIS — I255 Ischemic cardiomyopathy: Secondary | ICD-10-CM

## 2016-01-05 MED ORDER — LOSARTAN POTASSIUM-HCTZ 100-12.5 MG PO TABS: 1.0000 | ORAL_TABLET | Freq: Every day | ORAL | Status: DC

## 2016-05-24 ENCOUNTER — Encounter: Payer: Self-pay | Admitting: Cardiovascular Disease

## 2016-05-24 ENCOUNTER — Ambulatory Visit (INDEPENDENT_AMBULATORY_CARE_PROVIDER_SITE_OTHER): Payer: Medicare Other | Admitting: Cardiovascular Disease

## 2016-05-24 VITALS — BP 128/76 | HR 81 | Ht 69.0 in | Wt 202.0 lb

## 2016-05-24 DIAGNOSIS — E785 Hyperlipidemia, unspecified: Secondary | ICD-10-CM

## 2016-05-24 DIAGNOSIS — I10 Essential (primary) hypertension: Secondary | ICD-10-CM | POA: Diagnosis not present

## 2016-05-24 DIAGNOSIS — I2510 Atherosclerotic heart disease of native coronary artery without angina pectoris: Secondary | ICD-10-CM | POA: Diagnosis not present

## 2016-05-24 DIAGNOSIS — I2583 Coronary atherosclerosis due to lipid rich plaque: Secondary | ICD-10-CM

## 2016-05-24 NOTE — Patient Instructions (Signed)
Medication Instructions:  Your physician recommends that you continue on your current medications as directed. Please refer to the Current Medication list given to you today.   Labwork: Labwork will be requested from your primary care physician.   Testing/Procedures: N/A  Follow-Up: Your physician wants you to follow-up in: [place]. You will receive a reminder letter in the mail two months in advance. If you don't receive a letter, please call our office to schedule the follow-up appointment.  If you need a refill on your cardiac medications before your next appointment, please call your pharmacy.

## 2016-05-24 NOTE — Assessment & Plan Note (Signed)
History of hypertension blood pressure measured at 128/76. He is on atenolol, losartan and hydrochlorothiazide. Continue current meds at current dosing

## 2016-05-24 NOTE — Assessment & Plan Note (Signed)
History of CAD status post anterior STEMI 04/30/11 at 4:00 in the afternoon. He had an occluded proximal LAD he which I stented with a 3.5 mm x 18 mm long multi-link vision bare metal stent. His EF at that time was 30% with anteroapical akinesia. His peak CPK MB was 70 with a troponin of 21. The remainder of his epicardial arteries were normal. Repeat cardiac catheterization performed by Dr. Ellyn Hack 04/19/15 the setting of chest pain was entirely normal with a normal EF. He's had no recurrent symptoms.

## 2016-05-24 NOTE — Progress Notes (Signed)
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 His risk factors include a strong family history for heart disease. He suffered an anterior STEMI, April 30, 2011, at 4 o'clock in the afternoon, with an occluded proximal LAD, which I stented using a 3.5 x 18 mm long Multi-Link Vision bare-metal stent. His EF was 30% at that time with anteroapical akinesia. Peak CPK MB was 70 with a troponin of 21. His other epicardial arteries were normal. A followup echocardiogram and Myoview performed several months later revealed normal LV systolic function with complete salvage of myocardium and no evidence of scar. He had recurrent chest pain last month and underwent radial crit catheterization on 04/19/15 by Dr. Ellyn Hack revealing a widely patent stent with no significant CAD and normal LV function. He has had no recurrent symptoms. His lipid profile is followed by his primary care physician. Since I saw him a year ago he remained currently stable. He still plays tennis..   Current Outpatient Prescriptions  Medication Sig Dispense Refill  . aspirin 81 MG tablet Take 81 mg by mouth daily.    [name] atenolol (TENORMIN) 50 MG tablet Take 50 mg by mouth daily.    . clonazePAM (KLONOPIN) 0.5 MG tablet Take 0.5 mg by mouth 2 (two) times daily as needed for anxiety.    . fluticasone (FLONASE) 50 MCG/ACT nasal spray Place 2 sprays into the nose daily as needed for rhinitis.    [name] levothyroxine (SYNTHROID, LEVOTHROID) 50 MCG tablet Take 50 mcg by mouth daily before breakfast.    . losartan-hydrochlorothiazide (HYZAAR) 100-12.5 MG tablet Take 1 tablet by mouth daily.  30 tablet 11  . Multiple Vitamins-Minerals (ICAPS PO) Take 2 capsules by mouth daily.    . nitroGLYCERIN (NITROSTAT) 0.4 MG SL tablet Place 1 tablet (0.4 mg total) under the tongue every 5 (five) minutes as needed for chest pain (as needed for chest pain). 25 tablet 3  . rosuvastatin (CRESTOR) 20 MG tablet Take 20 mg by mouth daily.     No current facility-administered medications for this visit.    Allergies  Allergen Reactions  . Contrast Media [Iodinated Diagnostic Agents]     Sob, tightness in throat  . Ramipril Cough    Social History   Social History  . Marital Status: Divorced    Spouse Name: N/A  . Number of Children: 2  . Years of Education: N/A   Occupational History  . Buyer, retail    Social History Main Topics  . Smoking status: Never Smoker   . Smokeless tobacco: Not on file  . Alcohol Use: Yes     Comment: 1-2 drinks per week  . Drug Use: No  . Sexual Activity: Not on file   Other Topics Concern  . Not on file   Social History Narrative     Review of Systems: General: negative for chills, fever, night sweats or weight changes.  Cardiovascular: negative for chest pain, dyspnea on exertion, edema, orthopnea, palpitations, paroxysmal nocturnal dyspnea or shortness of breath Dermatological: negative for rash Respiratory: negative for cough or wheezing Urologic: negative for hematuria Abdominal: negative for nausea, vomiting, diarrhea,  bright red blood per rectum, melena, or hematemesis Neurologic: negative for visual changes, syncope, or dizziness All other systems reviewed and are otherwise negative except as noted above.    Blood pressure 128/76, pulse 81, height 5\' 9"  (1.753 m), weight 202 lb (91.627 kg).  General appearance: alert and no distress Neck: no adenopathy, no carotid bruit, no JVD, supple, symmetrical, trachea midline and thyroid not enlarged, symmetric, no tenderness/mass/nodules Lungs: clear to auscultation bilaterally Heart:  regular rate and rhythm, S1, S2 normal, no murmur, click, rub or gallop Extremities: extremities normal, atraumatic, no cyanosis or edema  EKG normal sinus rhythm 81 without ST or T-wave changes. I personally reviewed this EKG  ASSESSMENT AND PLAN:   Coronary artery disease History of CAD status post anterior STEMI 04/30/11 at 4:00 in the afternoon. He had an occluded proximal LAD he which I stented with a 3.5 mm x 18 mm long multi-link vision bare metal stent. His EF at that time was 30% with anteroapical akinesia. His peak CPK MB was 70 with a troponin of 21. The remainder of his epicardial arteries were normal. Repeat cardiac catheterization performed by Dr. Ellyn Hack 04/19/15 the setting of chest pain was entirely normal with a normal EF. He's had no recurrent symptoms.  Hyperlipidemia History of hyperlipidemia on statin therapy followed by his PCP  Essential hypertension History of hypertension blood pressure measured at 128/76. He is on atenolol, losartan and hydrochlorothiazide. Continue current meds at current dosing      Lorretta Harp MD [facility] [place] 05/24/2016 2:23 PM

## 2016-05-24 NOTE — Assessment & Plan Note (Signed)
History of hyperlipidemia on statin therapy followed by his PCP 

## 2016-07-14 IMAGING — CR DG ABDOMEN 1V
1 series · 1 of 1 positions shown · non-contrast
Comparison: 09/09/2015

CLINICAL DATA: Left nephrolithiasis.  Preop for lithotripsy.

EXAM:
ABDOMEN - 1 VIEW

[t abdomen supine]
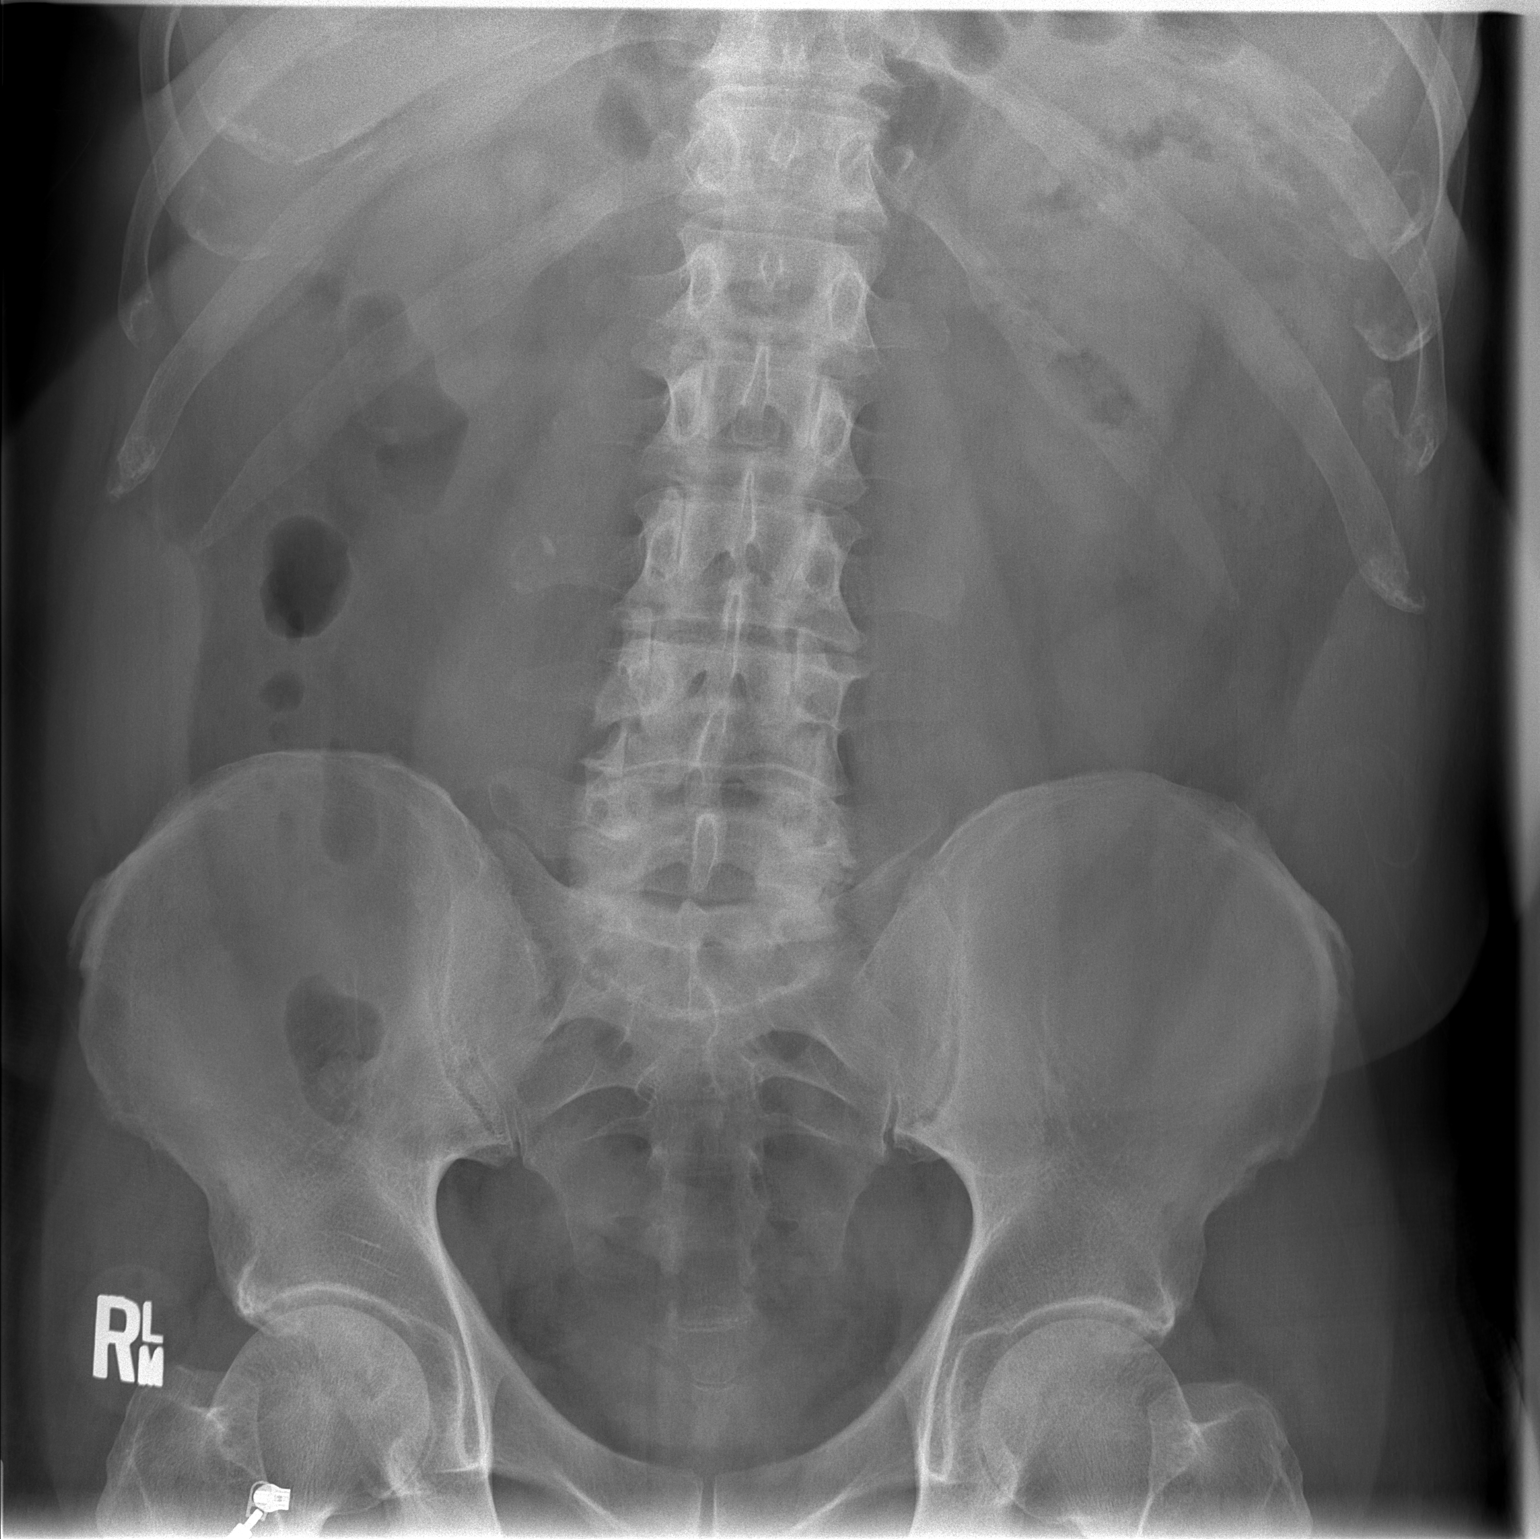

[1 of 1 positions shown; findings below may reference images not displayed]

FINDINGS: The bowel gas pattern is normal. Bowel gas overlying the kidneys
limits evaluation. There is at least 1 tiny 3 mm radiodensity again
seen overlying the upper pole the left kidney, consistent with tiny
renal calculus. No other definite radiopaque calculi identified.
IMPRESSION: Tiny approximately 3 mm left upper pole renal calculus again seen.

## 2016-10-31 ENCOUNTER — Other Ambulatory Visit: Payer: Self-pay | Admitting: Cardiovascular Disease

## 2017-01-08 ENCOUNTER — Other Ambulatory Visit: Payer: Self-pay | Admitting: Cardiovascular Disease

## 2017-01-08 NOTE — Telephone Encounter (Signed)
Call and leave message for pt to call, need med clarifications

## 2017-02-08 ENCOUNTER — Other Ambulatory Visit: Payer: Self-pay | Admitting: Cardiovascular Disease

## 2017-02-08 DIAGNOSIS — I255 Ischemic cardiomyopathy: Secondary | ICD-10-CM

## 2017-02-08 DIAGNOSIS — I10 Essential (primary) hypertension: Secondary | ICD-10-CM

## 2017-03-14 ENCOUNTER — Encounter: Payer: Self-pay | Admitting: Family Medicine

## 2017-03-21 ENCOUNTER — Encounter: Payer: Self-pay | Admitting: Internal Medicine

## 2017-03-22 ENCOUNTER — Ambulatory Visit
Admission: RE | Admit: 2017-03-22 | Discharge: 2017-03-22 | Disposition: A | Discharge status: Home / Self Care | Payer: Medicare Other | Source: Ambulatory Visit | Attending: Family Medicine | Admitting: Family Medicine

## 2017-03-22 ENCOUNTER — Other Ambulatory Visit: Payer: Self-pay | Admitting: Family Medicine

## 2017-03-22 DIAGNOSIS — R109 Unspecified abdominal pain: Secondary | ICD-10-CM

## 2017-03-22 DIAGNOSIS — R110 Nausea: Secondary | ICD-10-CM

## 2017-03-22 DIAGNOSIS — R197 Diarrhea, unspecified: Secondary | ICD-10-CM

## 2017-05-29 ENCOUNTER — Ambulatory Visit (INDEPENDENT_AMBULATORY_CARE_PROVIDER_SITE_OTHER): Payer: Medicare Other | Admitting: Cardiovascular Disease

## 2017-05-29 ENCOUNTER — Encounter: Payer: Self-pay | Admitting: Cardiovascular Disease

## 2017-05-29 VITALS — BP 138/86 | HR 69 | Ht 69.0 in | Wt 198.0 lb

## 2017-05-29 DIAGNOSIS — I2510 Atherosclerotic heart disease of native coronary artery without angina pectoris: Secondary | ICD-10-CM

## 2017-05-29 DIAGNOSIS — I10 Essential (primary) hypertension: Secondary | ICD-10-CM

## 2017-05-29 DIAGNOSIS — E7800 Pure hypercholesterolemia, unspecified: Secondary | ICD-10-CM | POA: Diagnosis not present

## 2017-05-29 NOTE — Assessment & Plan Note (Signed)
History of hyperlipidemia on statin therapy followed by his PCP 

## 2017-05-29 NOTE — Assessment & Plan Note (Signed)
History of essential hypertension with blood pressure measures 130/86. He is on atenolol, losartan and hydrochlorothiazide. Continue current meds at current dosing

## 2017-05-29 NOTE — Assessment & Plan Note (Signed)
History of CAD status post anterior STEMI 04/30/11 at 4:00 in the afternoon after playing tennis. I cathed him revealing an occluded proximal LAD and stented him using a 3.5 x 18 mm long Multi-Link vision bare metal stent. The CK-MB went up to 70 and troponin of 21. The remainder of his epicardial arteries were normal. EF at that time was 30% with anteroapical akinesia. A follow-up echo Myoview several months later revealed normal LV systolic function is completely myocardial salvage and no evidence of scar. He was recatheterized by Dr. Ellyn Hack 04/19/15 because of recurrent chest pain and had a widely patent stent with no other stenotic and CAD. He is fairly active and has remained a symptomatic since.

## 2017-05-29 NOTE — Patient Instructions (Signed)

## 2017-05-29 NOTE — Progress Notes (Signed)
05/29/2017 Devin Norris   05/16/1946  [id number]  Primary Physician London Pepper, MD Primary Cardiologist: Lorretta Harp MD Renae Gloss  HPI:  The patient is a very pleasant 71 year old mildly overweight divorced Caucasian male, father of 81, grandfather to 2 grandchildren, who I last saw in the office 05/24/16.Devin Norris He worked as an Buyer, retail and retired TO 2016. He is fairly active and still plays tennis, as well as working out 3 times per week.Devin Norris His risk factors include a strong family history for heart disease. He suffered an anterior STEMI, April 30, 2011, at 4 o'clock in the afternoon, with an occluded proximal LAD, which I stented using a 3.5 x 18 mm long Multi-Link Vision bare-metal stent. His EF was 30% at that time with anteroapical akinesia. Peak CPK MB was 70 with a troponin of 21. His other epicardial arteries were normal. A followup echocardiogram and Myoview performed several months later revealed normal LV systolic function with complete salvage of myocardium and no evidence of scar. He had recurrent chest pain last month and underwent radial crit catheterization on 04/19/15 by Dr. Ellyn Hack revealing a widely patent stent with no significant CAD and normal LV function. He has had no recurrent symptoms. His lipid profile is followed by his primary care physician. Since I saw him a year ago he remained currently stable. He still plays tennis..   Current Outpatient Prescriptions  Medication Sig Dispense Refill  . aspirin 81 MG tablet Take 81 mg by mouth daily.    Devin Norris atenolol (TENORMIN) 50 MG tablet Take 50 mg by mouth daily.    . clonazePAM (KLONOPIN) 0.5 MG tablet Take 0.5 mg by mouth 2 (two) times daily as needed for anxiety.    . fluticasone (FLONASE) 50 MCG/ACT nasal spray Place 2 sprays into the nose daily as needed for rhinitis.    Devin Norris levothyroxine (SYNTHROID, LEVOTHROID) 50 MCG tablet Take 50 mcg by mouth daily before breakfast.    .  losartan-hydrochlorothiazide (HYZAAR) 100-12.5 MG tablet TAKE 1 TABLET BY MOUTH  DAILY 90 tablet 3  . Multiple Vitamins-Minerals (ICAPS PO) Take 2 capsules by mouth daily.    Devin Norris NITROSTAT 0.4 MG SL tablet PLACE 1 TAB UNDER TONGUE EVERY 5 MINS AS NEEDED FOR CHEST PAIN ANS NEEDED 25 tablet 1  . rosuvastatin (CRESTOR) 20 MG tablet Take 20 mg by mouth daily.     No current facility-administered medications for this visit.     Allergies  Allergen Reactions  . Contrast Media [Iodinated Diagnostic Agents]     Sob, tightness in throat  . Ramipril Cough    Social History   Social History  . Marital status: Divorced    Spouse name: N/A  . Number of children: 2  . Years of education: N/A   Occupational History  . Buyer, retail    Social History Main Topics  . Smoking status: Never Smoker  . Smokeless tobacco: Never Used  . Alcohol use Yes     Comment: 1-2 drinks per week  . Drug use: No  . Sexual activity: Not on file   Other Topics Concern  . Not on file   Social History Narrative  . No narrative on file     Review of Systems: General: negative for chills, fever, night sweats or weight changes.  Cardiovascular: negative for chest pain, dyspnea on exertion, edema, orthopnea, palpitations, paroxysmal nocturnal dyspnea or shortness of breath Dermatological: negative for rash Respiratory: negative for cough or wheezing  Urologic: negative for hematuria Abdominal: negative for nausea, vomiting, diarrhea, bright red blood per rectum, melena, or hematemesis Neurologic: negative for visual changes, syncope, or dizziness All other systems reviewed and are otherwise negative except as noted above.    Blood pressure 138/86, pulse 69, height 5\' 9"  (1.753 m), weight 198 lb (89.8 kg).  General appearance: alert and no distress Neck: no adenopathy, no carotid bruit, no JVD, supple, symmetrical, trachea midline and thyroid not enlarged, symmetric, no tenderness/mass/nodules Lungs:  clear to auscultation bilaterally Heart: regular rate and rhythm, S1, S2 normal, no murmur, click, rub or gallop Extremities: extremities normal, atraumatic, no cyanosis or edema  EKG sinus rhythm at 69 with septal Q waves. I personally reviewed this EKG.  ASSESSMENT AND PLAN:   Coronary artery disease History of CAD status post anterior STEMI 04/30/11 at 4:00 in the afternoon after playing tennis. I cathed him revealing an occluded proximal LAD and stented him using a 3.5 x 18 mm long Multi-Link vision bare metal stent. The CK-MB went up to 70 and troponin of 21. The remainder of his epicardial arteries were normal. EF at that time was 30% with anteroapical akinesia. A follow-up echo Myoview several months later revealed normal LV systolic function is completely myocardial salvage and no evidence of scar. He was recatheterized by Dr. Ellyn Hack 04/19/15 because of recurrent chest pain and had a widely patent stent with no other stenotic and CAD. He is fairly active and has remained a symptomatic since.  Hyperlipidemia History of hyperlipidemia on statin therapy followed by his PCP  Essential hypertension History of essential hypertension with blood pressure measures 130/86. He is on atenolol, losartan and hydrochlorothiazide. Continue current meds at current dosing      Lorretta Harp MD [facility], [facility] 05/29/2017 12:09 PM

## 2017-07-12 ENCOUNTER — Other Ambulatory Visit: Payer: Self-pay | Admitting: Cardiovascular Disease

## 2017-11-28 ENCOUNTER — Other Ambulatory Visit: Payer: Self-pay | Admitting: Cardiovascular Disease

## 2017-11-28 DIAGNOSIS — I10 Essential (primary) hypertension: Secondary | ICD-10-CM

## 2017-11-28 DIAGNOSIS — I255 Ischemic cardiomyopathy: Secondary | ICD-10-CM

## 2017-11-28 NOTE — Telephone Encounter (Signed)
Rx has been sent to the pharmacy electronically. ° °

## 2017-12-14 IMAGING — CT CT ABD-PELV W/O CM
3 of 4 series · 11 of 36 positions shown, 17 images · IV contrast (READICAT/WATER)
Comparison: 08/23/2015

CLINICAL DATA: Lower pelvic pain, nausea

EXAM:
CT ABDOMEN AND PELVIS WITHOUT CONTRAST
TECHNIQUE: Multidetector CT imaging of the abdomen and pelvis was performed
following the standard protocol without IV contrast.

[Series 3: abd/pelvis w/o · axial · non-contrast · 0.80mm/px · z∈[-311,+39]mm · 8 of 90 slices shown, 13 images]
[im 10/90  soft-tissue]
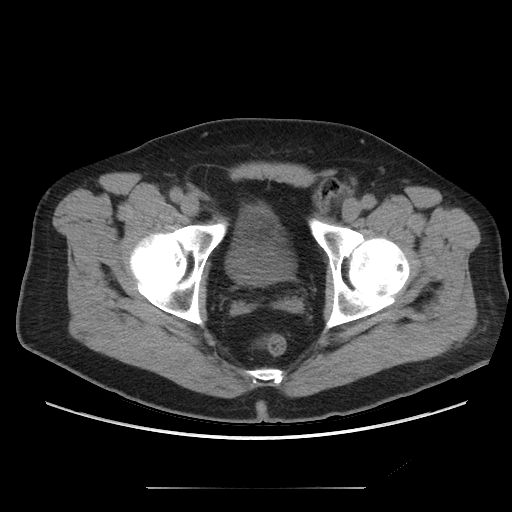
[im 10/90  bone]
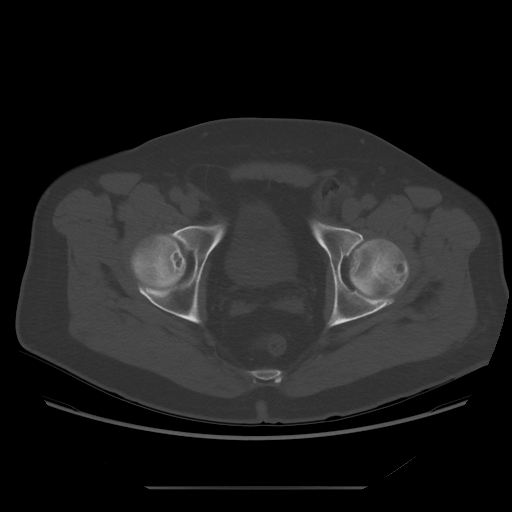
[im 20/90  soft-tissue]
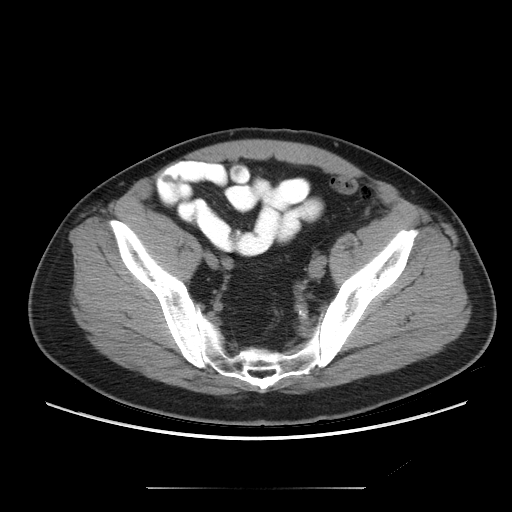
[im 30/90  soft-tissue]
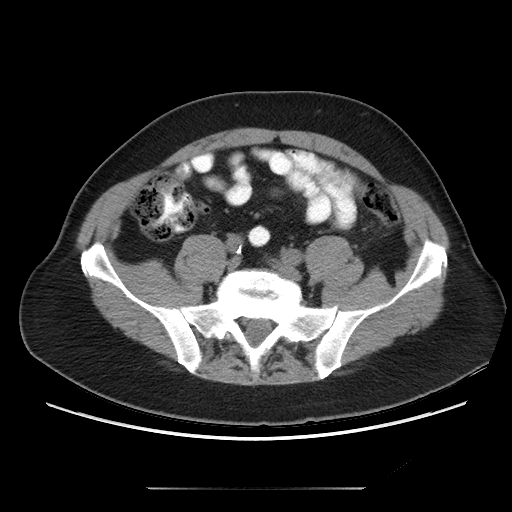
[im 40/90  soft-tissue]
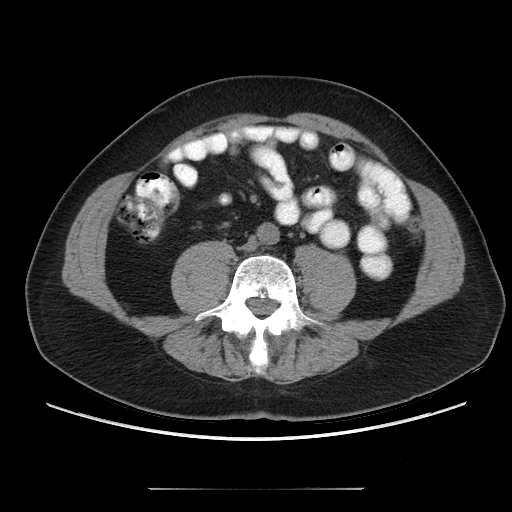
[im 50/90  soft-tissue]
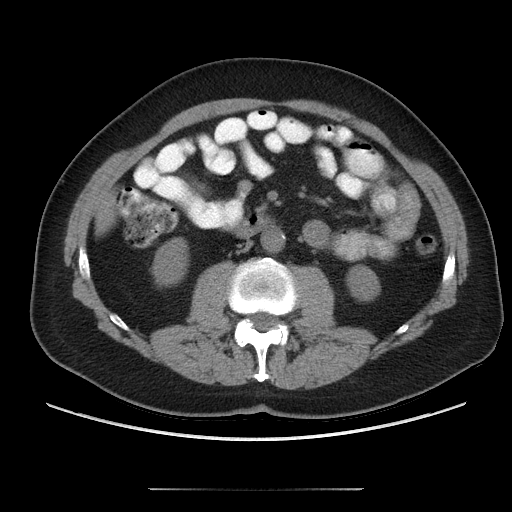
[im 50/90  lung]
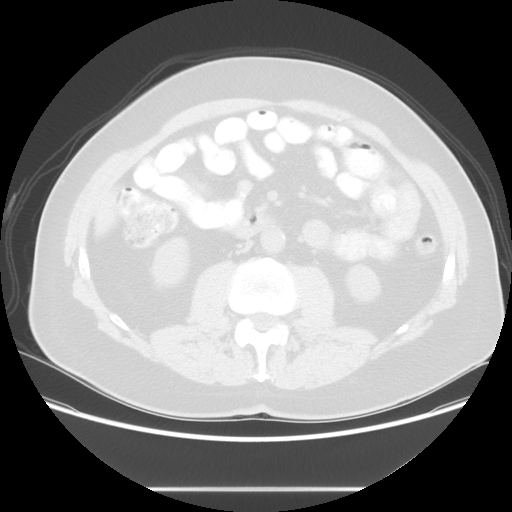
[im 60/90  soft-tissue]
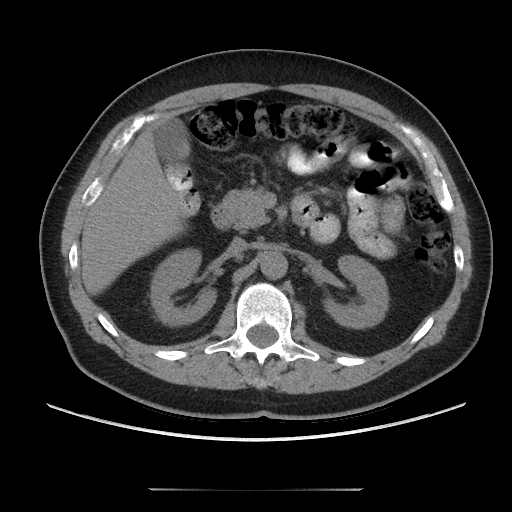
[im 60/90  lung]
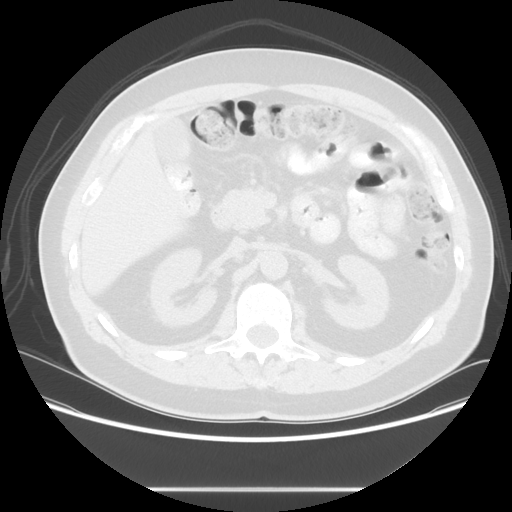
[im 70/90  soft-tissue]
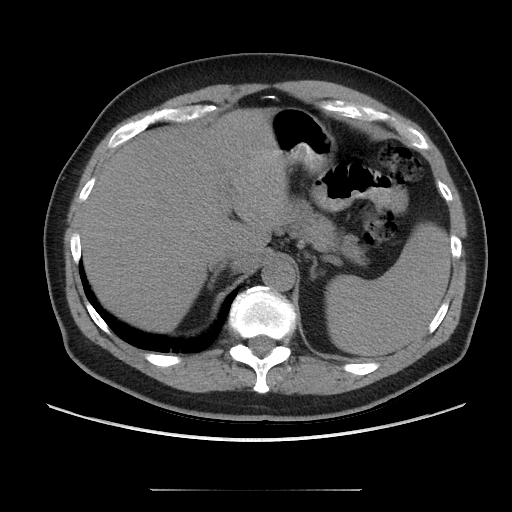
[im 70/90  lung]
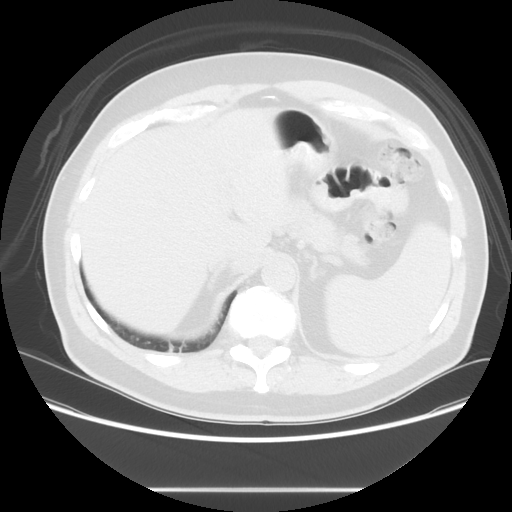
[im 80/90  soft-tissue]
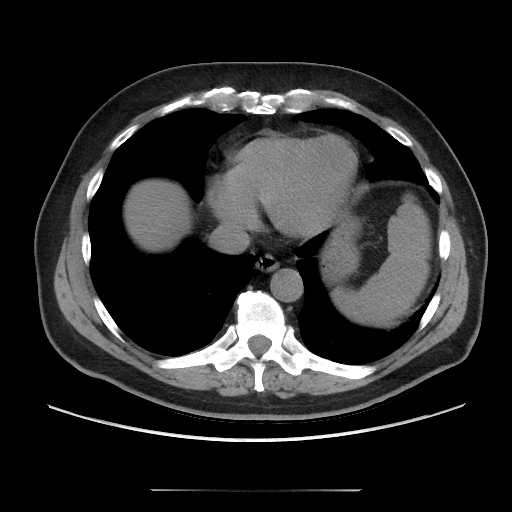
[im 80/90  lung]
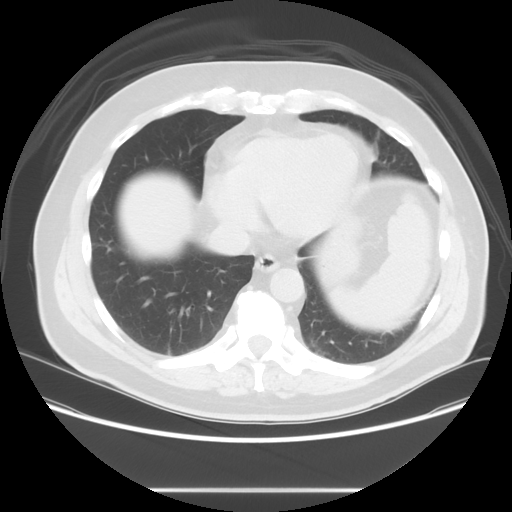

[Series 4: lung windows · axial · 0.80mm/px · z∈[-11,+39]mm · 2 of 31 slices shown]
[im 11/31  bone]
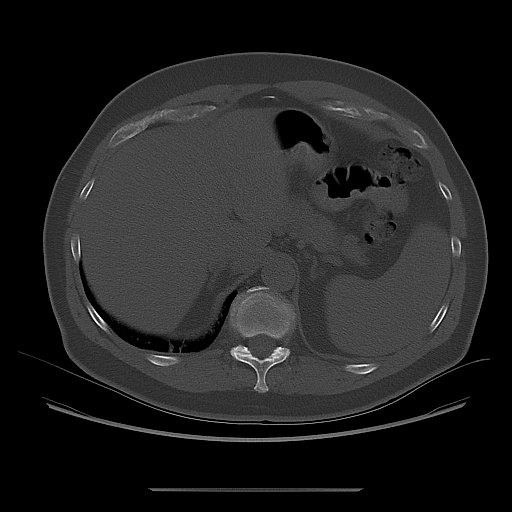
[im 21/31  bone]
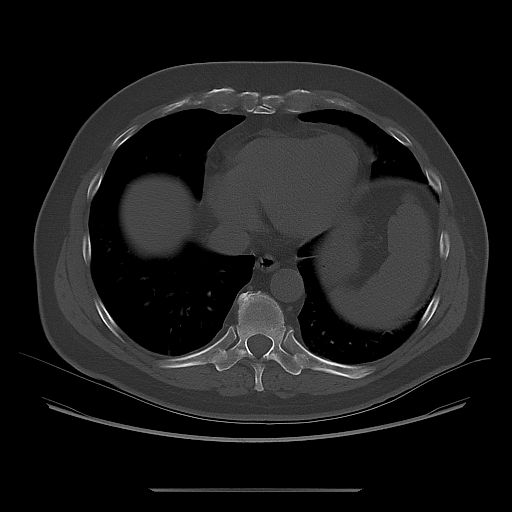

[Series 601: coronal body · coronal · 1.02mm/px · 1 of 123 slices shown, 2 images]
[im 41/123  soft-tissue]
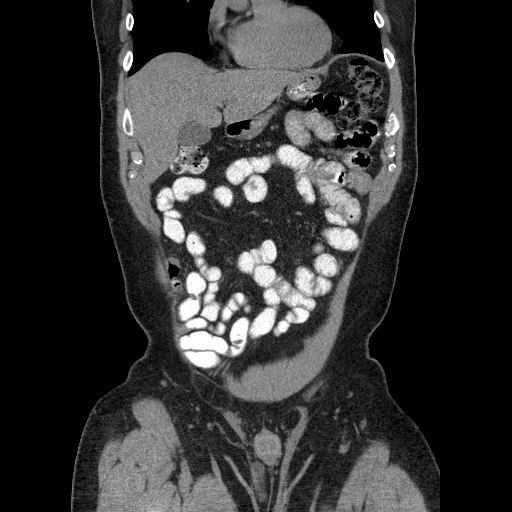
[im 41/123  bone]
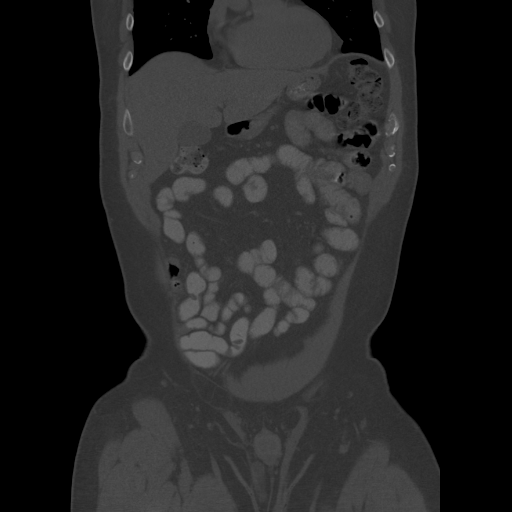

[11 of 36 positions shown; findings below may reference images not displayed]

FINDINGS: Lower chest: Lung bases are clear. No effusions. Heart is normal
size.

Hepatobiliary: No focal hepatic abnormality. Gallbladder
unremarkable.

Pancreas: No focal abnormality or ductal dilatation.

Spleen: No focal abnormality.  Normal size.

Adrenals/Urinary Tract: No adrenal abnormality. No focal renal
abnormality. No stones or hydronephrosis. Urinary bladder is
unremarkable.

Stomach/Bowel: Appendix is normal. Scattered sigmoid diverticula. No
active diverticulitis. Stomach and small bowel decompressed,
unremarkable.

Vascular/Lymphatic: No evidence of aneurysm or adenopathy. Scattered
aortic calcifications.

Reproductive: No visible focal abnormality.

Other: Small bilateral inguinal hernias containing fat and a small
portion of sigmoid colon in the left inguinal hernia. No
obstruction. No free fluid or free air.

Musculoskeletal: Degenerative disc disease in the lower lumbar
spine. No acute bony abnormality.
IMPRESSION: No acute findings in the abdomen or pelvis.

Small bilateral inguinal hernias containing fat. The left inguinal
hernia also contains a short segment of sigmoid colon. No
obstruction.

Sigmoid diverticulosis.

## 2018-01-14 ENCOUNTER — Other Ambulatory Visit: Payer: Self-pay | Admitting: *Deleted

## 2018-01-14 MED ORDER — ROSUVASTATIN CALCIUM 40 MG PO TABS: 40.0000 mg | ORAL_TABLET | Freq: Every day | ORAL | 6 refills | Status: DC

## 2018-04-22 DIAGNOSIS — M25511 Pain in right shoulder: Secondary | ICD-10-CM | POA: Insufficient documentation

## 2018-05-07 ENCOUNTER — Other Ambulatory Visit: Payer: Self-pay | Admitting: Cardiovascular Disease

## 2018-05-07 DIAGNOSIS — I255 Ischemic cardiomyopathy: Secondary | ICD-10-CM

## 2018-05-07 DIAGNOSIS — I10 Essential (primary) hypertension: Secondary | ICD-10-CM

## 2018-07-02 ENCOUNTER — Encounter: Payer: Self-pay | Admitting: Cardiovascular Disease

## 2018-07-02 ENCOUNTER — Ambulatory Visit: Payer: Medicare Other | Admitting: Cardiovascular Disease

## 2018-07-02 VITALS — BP 128/72 | HR 63 | Ht 69.0 in | Wt 187.9 lb

## 2018-07-02 DIAGNOSIS — I2510 Atherosclerotic heart disease of native coronary artery without angina pectoris: Secondary | ICD-10-CM

## 2018-07-02 DIAGNOSIS — E7800 Pure hypercholesterolemia, unspecified: Secondary | ICD-10-CM | POA: Diagnosis not present

## 2018-07-02 DIAGNOSIS — I10 Essential (primary) hypertension: Secondary | ICD-10-CM | POA: Diagnosis not present

## 2018-07-02 NOTE — Assessment & Plan Note (Signed)
History of hyperlipidemia on statin therapy. 

## 2018-07-02 NOTE — Assessment & Plan Note (Signed)
History of CAD status post anterior STEMI 04/30/2011 at 4:00 in the afternoon when I cath him revealing a proximal LAD occlusion which I stented using a 3.5 mm x 18 mm long MultiLink vision bare-metal stent.  EF at that time was 30% with anteroapical akinesia with a CPK-MB of 70.  Other arteries were normal.  Follow-up Myoview several months later revealed normal LV function with complete salvage and no evidence of ischemia or scar.  He underwent repeat cardiac catheterization by Dr. Ellyn Hack radially 04/19/2015 revealing a widely patent stent with significant CAD and normal LV function.  He denies chest pain or shortness of breath.  He remains on a baby aspirin.

## 2018-07-02 NOTE — Assessment & Plan Note (Signed)
History of essential hypertension her blood pressure measured at 120/72.  He is on atenolol, losartan and hydrochlorothiazide.

## 2018-07-02 NOTE — Patient Instructions (Signed)
Your physician wants you to follow-up in: ONE YEAR WITH DR BERRY You will receive a reminder letter in the mail two months in advance. If you don't receive a letter, please call our office to schedule the follow-up appointment.   If you need a refill on your cardiac medications before your next appointment, please call your pharmacy.  

## 2018-07-02 NOTE — Progress Notes (Signed)
07/02/2018 Devin Norris   02-Dec-1945  [id number]  Primary Physician London Pepper, MD Primary Cardiologist: Lorretta Harp MD FACP, [place], [place], [place]  HPI:  Devin Norris is a 72 y.o.  mildly overweight divorced Caucasian male, father of 2, grandfather to 2 grandchildren, who I last saw in the office  05/29/2017.[name] He worked as an Buyer, retail and retired TO 2016. He is fairly active and still plays tennis, as well as working out 3 times per week.[name] His risk factors include a strong family history for heart disease. He suffered an anterior STEMI, April 30, 2011, at 4 o'clock in the afternoon, with an occluded proximal LAD, which I stented using a 3.5 x 18 mm long Multi-Link Vision bare-metal stent. His EF was 30% at that time with anteroapical akinesia. Peak CPK MB was 70 with a troponin of 21. His other epicardial arteries were normal. A followup echocardiogram and Myoview performed several months later revealed normal LV systolic function with complete salvage of myocardium and no evidence of scar. He had recurrent chest pain last month and underwent radial crit catheterization on 04/19/15 by Dr. Ellyn Hack revealing a widely patent stent with no significant CAD and normal LV function. He has had no recurrent symptoms. His lipid profile is followed by his primary care physician.   Since I saw him a year ago he still plays tennis several days a week.  He denies chest pain or shortness of breath.  He is in the middle of divorce and has lost 10 pounds.   Current Meds  Medication Sig  . aspirin 81 MG tablet Take 81 mg by mouth daily.  [name] atenolol (TENORMIN) 50 MG tablet Take 50 mg by mouth daily.  . clonazePAM (KLONOPIN) 0.5 MG tablet Take 0.5 mg by mouth 2 (two) times daily as needed for anxiety.  . fluticasone (FLONASE) 50 MCG/ACT nasal spray Place 2 sprays into the nose daily as needed for rhinitis.  [name] levothyroxine (SYNTHROID, LEVOTHROID) 50 MCG tablet Take 50 mcg by mouth daily before  breakfast.  . losartan-hydrochlorothiazide (HYZAAR) 100-12.5 MG tablet Take 1 tablet by mouth daily.  . Multiple Vitamins-Minerals (ICAPS PO) Take 2 capsules by mouth daily.  [name] NITROSTAT 0.4 MG SL tablet PLACE 1 TAB UNDER TONGUE EVERY 5 MINS AS NEEDED FOR CHEST PAIN ANS NEEDED  . rosuvastatin (CRESTOR) 40 MG tablet Take 1 tablet (40 mg total) by mouth daily. (Patient taking differently: Take by mouth daily. Pt takes 20 mg daily)     Allergies  Allergen Reactions  . Contrast Media [Iodinated Diagnostic Agents]     Sob, tightness in throat  . Ramipril Cough    Social History   Socioeconomic History  . Marital status: Divorced    Spouse name: Not on file  . Number of children: 2  . Years of education: Not on file  . Highest education level: Not on file  Occupational History  . Occupation: Buyer, retail  Social Needs  . Financial resource strain: Not on file  . Food insecurity:    Worry: Not on file    Inability: Not on file  . Transportation needs:    Medical: Not on file    Non-medical: Not on file  Tobacco Use  . Smoking status: Never Smoker  . Smokeless tobacco: Never Used  Substance and Sexual Activity  . Alcohol use: Yes    Comment: 1-2 drinks per week  . Drug use: No  . Sexual activity: Not on file  Lifestyle  . Physical activity:    Days per week: Not on file    Minutes per session: Not on file  . Stress: Not on file  Relationships  . Social connections:    Talks on phone: Not on file    Gets together: Not on file    Attends religious service: Not on file    Active member of club or organization: Not on file    Attends meetings of clubs or organizations: Not on file    Relationship status: Not on file  . Intimate partner violence:    Fear of current or ex partner: Not on file    Emotionally abused: Not on file    Physically abused: Not on file    Forced sexual activity: Not on file  Other Topics Concern  . Not on file  Social History Narrative  .  Not on file     Review of Systems: General: negative for chills, fever, night sweats or weight changes.  Cardiovascular: negative for chest pain, dyspnea on exertion, edema, orthopnea, palpitations, paroxysmal nocturnal dyspnea or shortness of breath Dermatological: negative for rash Respiratory: negative for cough or wheezing Urologic: negative for hematuria Abdominal: negative for nausea, vomiting, diarrhea, bright red blood per rectum, melena, or hematemesis Neurologic: negative for visual changes, syncope, or dizziness All other systems reviewed and are otherwise negative except as noted above.    Blood pressure 128/72, pulse 63, height 5\' 9"  (1.753 m), weight 187 lb 14.4 oz (85.2 kg).  General appearance: alert and no distress Neck: no adenopathy, no carotid bruit, no JVD, supple, symmetrical, trachea midline and thyroid not enlarged, symmetric, no tenderness/mass/nodules Lungs: clear to auscultation bilaterally Heart: regular rate and rhythm, S1, S2 normal, no murmur, click, rub or gallop Extremities: extremities normal, atraumatic, no cyanosis or edema Pulses: 2+ and symmetric Skin: Skin color, texture, turgor normal. No rashes or lesions Neurologic: Alert and oriented X 3, normal strength and tone. Normal symmetric reflexes. Normal coordination and gait  EKG sinus rhythm at 63 without ST or T wave changes.  I personally reviewed this EKG.  ASSESSMENT AND PLAN:   Coronary artery disease History of CAD status post anterior STEMI 04/30/2011 at 4:00 in the afternoon when I cath him revealing a proximal LAD occlusion which I stented using a 3.5 mm x 18 mm long MultiLink vision bare-metal stent.  EF at that time was 30% with anteroapical akinesia with a CPK-MB of 70.  Other arteries were normal.  Follow-up Myoview several months later revealed normal LV function with complete salvage and no evidence of ischemia or scar.  He underwent repeat cardiac catheterization by Dr. Ellyn Hack  radially 04/19/2015 revealing a widely patent stent with significant CAD and normal LV function.  He denies chest pain or shortness of breath.  He remains on a baby aspirin.  Hyperlipidemia History of hyperlipidemia on statin therapy  Essential hypertension History of essential hypertension her blood pressure measured at 120/72.  He is on atenolol, losartan and hydrochlorothiazide.      Lorretta Harp MD FACP,FACC,FAHA, [facility] 07/02/2018 9:45 AM

## 2019-01-05 ENCOUNTER — Other Ambulatory Visit: Payer: Self-pay | Admitting: Cardiovascular Disease

## 2019-01-05 DIAGNOSIS — I255 Ischemic cardiomyopathy: Secondary | ICD-10-CM

## 2019-01-05 DIAGNOSIS — I10 Essential (primary) hypertension: Secondary | ICD-10-CM

## 2019-06-02 ENCOUNTER — Other Ambulatory Visit: Payer: Self-pay | Admitting: Cardiovascular Disease

## 2019-06-02 DIAGNOSIS — I255 Ischemic cardiomyopathy: Secondary | ICD-10-CM

## 2019-06-02 DIAGNOSIS — I10 Essential (primary) hypertension: Secondary | ICD-10-CM

## 2019-07-01 ENCOUNTER — Other Ambulatory Visit: Payer: Self-pay

## 2019-07-01 ENCOUNTER — Encounter: Payer: Self-pay | Admitting: Cardiology

## 2019-07-01 ENCOUNTER — Ambulatory Visit (INDEPENDENT_AMBULATORY_CARE_PROVIDER_SITE_OTHER): Payer: Medicare Other | Admitting: General Practice

## 2019-07-01 VITALS — BP 128/72 | HR 74 | Temp 97.5°F | Ht 69.0 in | Wt 184.0 lb

## 2019-07-01 DIAGNOSIS — E7800 Pure hypercholesterolemia, unspecified: Secondary | ICD-10-CM

## 2019-07-01 DIAGNOSIS — I10 Essential (primary) hypertension: Secondary | ICD-10-CM

## 2019-07-01 DIAGNOSIS — I2510 Atherosclerotic heart disease of native coronary artery without angina pectoris: Secondary | ICD-10-CM | POA: Diagnosis not present

## 2019-07-01 MED ORDER — NITROGLYCERIN 0.4 MG SL SUBL: 0.4000 mg | SUBLINGUAL_TABLET | SUBLINGUAL | 1 refills | Status: DC | PRN

## 2019-07-01 NOTE — Patient Instructions (Addendum)

## 2019-07-01 NOTE — Progress Notes (Signed)
Cardiology Clinic Note   Patient Name: Devin Norris Date of Encounter: 07/01/2019  Primary Care Provider:  London Pepper, MD Primary Cardiologist:  Quay Burow, MD  Patient Profile    Devin Norris 73 year old male presents today for follow-up of his coronary artery disease and hypertension.  Past Medical History    Past Medical History:  Diagnosis Date  . CAD (coronary artery disease)   . Family history of heart disease   . History of nuclear stress test 07/28/2011   bruce protocol, f/up MI; normal, without evidence of scar  . Hyperlipidemia   . Hypothyroidism   . Renal stones    passed stone  . STEMI (ST elevation myocardial infarction) ([place]) 04/30/2011   anterior wall   Past Surgical History:  Procedure Laterality Date  . CARDIAC CATHETERIZATION N/A 04/19/2015   Procedure: Left Heart Cath and Coronary Angiography;  Surgeon: Leonie Man, MD;  Location: [facility];  Service: Cardiovascular;  Laterality: N/A;  . CORONARY ANGIOPLASTY WITH STENT PLACEMENT  04/30/2011   r/t anterior wall STEMI; 3.5x20mm Multi-Link Vision bare metal stent to LAD  . NASAL SEPTUM SURGERY     30 years ago  . TRANSTHORACIC ECHOCARDIOGRAM  07/28/2011   EF 50-55%, mild conc LVH; mild MR/TR; mild calcif of aortic valve leaflets    Allergies  Allergies  Allergen Reactions  . Contrast Media [Iodinated Diagnostic Agents]     Sob, tightness in throat  . Ramipril Cough    History of Present Illness    Devin Norris was last seen by Dr. Gwenlyn Found on 07/02/2018.  During that time he was doing well.  He was active playing tennis and working out about 3 times per week.  He has a history of coronary artery disease with anterior STEMI on 04/30/2011.  He was taken to the Cath Lab and received a BMS to his proximal LAD.  His EF during that time was 30% with anterioapical akinesis and he did not appear to have any other coronary disease.  A follow-up echocardiogram and Myoview showed normal  LVEF and no evidence of scarring.  He had recurrent chest pain and was again catheterized on 04/19/2015 by Dr. Ellyn Hack which showed a patent LAD stent and no significant disease with normal EF.  His PMH also includes essential hypertension, hyperlipidemia, family history of heart disease,  and unstable angina.  He presents to the clinic today and states he continues to be active playing tennis and exercising 3 or more days per week.  He states that he has had one episode of dizziness around this time last year which he talked to Dr. Gwenlyn Found about and was decided to be dehydration.  He makes sure that he stays hydrated while he plays tennis and with increased exertion while he is outside.  He denies chest pain, shortness of breath, lower extremity edema, fatigue, palpitations, melena, hematuria, hemoptysis, diaphoresis, weakness, presyncope, syncope, orthopnea, and PND.   Home Medications    Prior to Admission medications   Medication Sig Start Date End Date Taking? Authorizing Provider  aspirin 81 MG tablet Take 81 mg by mouth daily.    [provider]  atenolol (TENORMIN) 50 MG tablet Take 50 mg by mouth daily.    [provider]  clonazePAM (KLONOPIN) 0.5 MG tablet Take 0.5 mg by mouth 2 (two) times daily as needed for anxiety.    [provider]  fluticasone (FLONASE) 50 MCG/ACT nasal spray Place 2 sprays into the nose daily  as needed for rhinitis.    [provider]  levothyroxine (SYNTHROID, LEVOTHROID) 50 MCG tablet Take 50 mcg by mouth daily before breakfast.    [provider]  losartan-hydrochlorothiazide (HYZAAR) 100-12.5 MG tablet TAKE 1 TABLET BY MOUTH  DAILY 06/02/19   Lorretta Harp, MD  Multiple Vitamins-Minerals (ICAPS PO) Take 2 capsules by mouth daily.    [provider]  NITROSTAT 0.4 MG SL tablet PLACE 1 TAB UNDER TONGUE EVERY 5 MINS AS NEEDED FOR CHEST PAIN ANS NEEDED 11/01/16   Lorretta Harp, MD  rosuvastatin  (CRESTOR) 40 MG tablet Take 1 tablet (40 mg total) by mouth daily. Patient taking differently: Take by mouth daily. Pt takes 20 mg daily 01/14/18   Lorretta Harp, MD    Family History    Family History  Problem Relation Age of Onset  . Heart disease Mother   . Lung cancer Mother   . Heart disease Father   . Lung cancer Father   . Heart attack Father   . Hypertension Father   . Heart disease Brother   . Heart attack Brother   . Hypertension Brother   . Stroke Paternal Grandfather    He indicated that his mother is deceased. He indicated that his father is deceased. He indicated that the status of his paternal grandfather is unknown.  Social History    Social History   Socioeconomic History  . Marital status: Divorced    Spouse name: Not on file  . Number of children: 2  . Years of education: Not on file  . Highest education level: Not on file  Occupational History  . Occupation: Buyer, retail  Social Needs  . Financial resource strain: Not on file  . Food insecurity    Worry: Not on file    Inability: Not on file  . Transportation needs    Medical: Not on file    Non-medical: Not on file  Tobacco Use  . Smoking status: Never Smoker  . Smokeless tobacco: Never Used  Substance and Sexual Activity  . Alcohol use: Yes    Comment: 1-2 drinks per week  . Drug use: No  . Sexual activity: Not on file  Lifestyle  . Physical activity    Days per week: Not on file    Minutes per session: Not on file  . Stress: Not on file  Relationships  . Social Herbalist on phone: Not on file    Gets together: Not on file    Attends religious service: Not on file    Active member of club or organization: Not on file    Attends meetings of clubs or organizations: Not on file    Relationship status: Not on file  . Intimate partner violence    Fear of current or ex partner: Not on file    Emotionally abused: Not on file    Physically abused: Not on file    Forced  sexual activity: Not on file  Other Topics Concern  . Not on file  Social History Narrative  . Not on file     Review of Systems    General:  No chills, fever, night sweats or weight changes.  Cardiovascular:  No chest pain, dyspnea on exertion, edema, orthopnea, palpitations, paroxysmal nocturnal dyspnea. Dermatological: No rash, lesions/masses Respiratory: No cough, dyspnea Urologic: No hematuria, dysuria Abdominal:   No nausea, vomiting, diarrhea, bright red blood per rectum, melena, or hematemesis Neurologic:  No visual  changes, wkns, changes in mental status. All other systems reviewed and are otherwise negative except as noted above.  Physical Exam    VS:  BP 128/72   Pulse 74   Temp (!) 97.5 F (36.4 C) (Temporal)   Ht 5\' 9"  (1.753 m)   Wt 184 lb (83.5 kg)   SpO2 97%   BMI 27.17 kg/m  , BMI Body mass index is 27.17 kg/m. GEN: Well nourished, well developed, in no acute distress. HEENT: normal. Neck: Supple, no JVD, carotid bruits, or masses. Cardiac: RRR, no murmurs, rubs, or gallops. No clubbing, cyanosis, edema.  Radials/DP/PT 2+ and equal bilaterally.  Respiratory:  Respirations regular and unlabored, clear to auscultation bilaterally. GI: Soft, nontender, nondistended, BS + x 4. MS: no deformity or atrophy. Skin: warm and dry, no rash. Neuro:  Strength and sensation are intact. Psych: Normal affect.  Accessory Clinical Findings    ECG personally reviewed by me today-normal sinus rhythm 71 bpm- No acute changes  EKG 07/02/2018 Normal sinus rhythm 63 bpm  EKG 05/29/2017 Normal sinus rhythm 69 bpm  Echocardiogram 07/28/2011 EF 50-55%, mild conc LVH; mild MR/TR; mild calcif of aortic valve leaflets    Cardiac catheterization 04/19/2015 1. The left ventricular systolic function is normal. Normal LVEDP 2. Prox LAD to Mid LAD lesion, the previously placed bare metal stent was 20% stenosed. The lesion was previously treated with a bare metal stent greater than  two years ago. 2012; Anterior STEMI 3. Ost 1st Sept lesion, 80% stenosed.   Angiographically minimal CAD with ~20% ISR of BMS in prox LAD. No lesion to explain Unstable Angina Symptoms.   Recommendations:  Standard post radial cath care with TR band removal.   Assessment & Plan   1.  Coronary artery disease-no chest pain today, no shortness of breath.  EKG normal sinus rhythm 71 bpm Continue 81 mg aspirin daily Heart healthy low-sodium diet Increase physical activity as tolerated  2.  Hyperlipidemia-LDL 67 10/06/2014 Continue rosuvastatin 20 mg tablet daily Heart healthy low-sodium diet Increase physical activity as tolerated  3.  Essential hypertension-well-controlled 128/72 today Continue atenolol 50 mg daily Continue losartan-hydrochlorothiazide 100-12.5 mg daily Continue nitroglycerin 0.4 mg sublingual tablet as needed Heart healthy low-sodium diet Increase physical activity as tolerated   Disposition: Follow-up with Dr. Gwenlyn Found in 1 year.    Deberah Pelton, NP-C 07/01/2019, 12:05 PM

## 2019-08-06 ENCOUNTER — Encounter

## 2019-08-19 ENCOUNTER — Other Ambulatory Visit: Payer: Self-pay | Admitting: Cardiovascular Disease

## 2019-08-19 DIAGNOSIS — I10 Essential (primary) hypertension: Secondary | ICD-10-CM

## 2019-08-19 DIAGNOSIS — I255 Ischemic cardiomyopathy: Secondary | ICD-10-CM

## 2020-01-06 ENCOUNTER — Other Ambulatory Visit: Payer: Self-pay | Admitting: Cardiovascular Disease

## 2020-01-06 DIAGNOSIS — I255 Ischemic cardiomyopathy: Secondary | ICD-10-CM

## 2020-01-06 DIAGNOSIS — I10 Essential (primary) hypertension: Secondary | ICD-10-CM

## 2020-06-12 ENCOUNTER — Other Ambulatory Visit: Payer: Self-pay | Admitting: Cardiovascular Disease

## 2020-06-12 DIAGNOSIS — I10 Essential (primary) hypertension: Secondary | ICD-10-CM

## 2020-06-12 DIAGNOSIS — I255 Ischemic cardiomyopathy: Secondary | ICD-10-CM

## 2020-06-30 ENCOUNTER — Other Ambulatory Visit: Payer: Self-pay

## 2020-06-30 ENCOUNTER — Encounter: Payer: Self-pay | Admitting: Cardiovascular Disease

## 2020-06-30 ENCOUNTER — Ambulatory Visit: Payer: Medicare Other | Admitting: Cardiovascular Disease

## 2020-06-30 VITALS — BP 136/72 | HR 68 | Ht 69.0 in | Wt 187.4 lb

## 2020-06-30 DIAGNOSIS — E782 Mixed hyperlipidemia: Secondary | ICD-10-CM

## 2020-06-30 DIAGNOSIS — I2510 Atherosclerotic heart disease of native coronary artery without angina pectoris: Secondary | ICD-10-CM | POA: Diagnosis not present

## 2020-06-30 DIAGNOSIS — I10 Essential (primary) hypertension: Secondary | ICD-10-CM | POA: Diagnosis not present

## 2020-06-30 NOTE — Assessment & Plan Note (Signed)
History of essential hypertension a blood pressure measured today at 136/72.  He is on atenolol, losartan and hydrochlorothiazide. 

## 2020-06-30 NOTE — Assessment & Plan Note (Signed)
History of CAD status post anterior STEMI 04/30/2011 at 4:00 in the afternoon.  He had a proximal LAD occlusion which I stented with a 3.5 mm x 18 mm long MultiLink vision bare-metal stent.  His EF was 30% at that time.  Follow-up echo revealed normalization of LV function and Myoview showed complete salvage myocardium without evidence of scar.  He is very active and denies chest pain or shortness of breath.  He did have a subsequent cardiac catheterization 04/19/2015 by Dr. Ellyn Hack revealing a widely patent stent with no other significant CAD.

## 2020-06-30 NOTE — Progress Notes (Signed)
06/30/2020 Devin Norris   Oct 27, 1946  [id number]  Primary Physician Devin Pepper, MD Primary Cardiologist: Devin Harp MD FACP, [place], [place], [place]  HPI:  Devin Norris is a 74 y.o.  mildly overweight divorced Caucasian male, father of 2, grandfather to 2 grandchildren, who I last saw in the office  07/02/2018.Devin Norris He workedas an Buyer, retail and retired TO 2016.He is fairly active and still plays tennis,as well as working out 3 times per week.Devin Norris His risk factors include a strong family history for heart disease. He suffered an anterior STEMI, April 30, 2011, at 4 o'clock in the afternoon, with an occluded proximal LAD, which I stented using a 3.5 x 18 mm long Multi-Link Vision bare-metal stent. His EF was 30% at that time with anteroapical akinesia. Peak CPK MB was 70 with a troponin of 21. His other epicardial arteries were normal. A followup echocardiogram and Myoview performed several months later revealed normal LV systolic function with complete salvage of myocardium and no evidence of scar. He had recurrent chest pain last month and underwent radial crit catheterization on 04/19/15 by Dr. Ellyn Norris revealing a widely patent stent with no significant CAD and normal LV function. He has had no recurrent symptoms. His lipid profile is followed by his primary care physician.   Since I saw him 2 years ago (he saw Devin Memos, FNP 1 year ago) he still plays tennis several days a week.  He denies chest pain or shortness of breath.    Current Meds  Medication Sig  . aspirin 81 MG tablet Take 81 mg by mouth daily.  Devin Norris atenolol (TENORMIN) 50 MG tablet Take 50 mg by mouth daily.  . clonazePAM (KLONOPIN) 0.5 MG tablet Take 0.5 mg by mouth 2 (two) times daily as needed for anxiety.  . fluticasone (FLONASE) 50 MCG/ACT nasal spray Place 2 sprays into the nose daily as needed for rhinitis.  Devin Norris levothyroxine (SYNTHROID, LEVOTHROID) 50 MCG tablet Take 50 mcg by mouth daily before breakfast.    . losartan-hydrochlorothiazide (HYZAAR) 100-12.5 MG tablet TAKE 1 TABLET BY MOUTH  DAILY  . Multiple Vitamins-Minerals (ICAPS PO) Take 2 capsules by mouth daily.  . nitroGLYCERIN (NITROSTAT) 0.4 MG SL tablet Place 1 tablet (0.4 mg total) under the tongue every 5 (five) minutes as needed for chest pain.  . rosuvastatin (CRESTOR) 20 MG tablet Take 20 mg by mouth daily.     Allergies  Allergen Reactions  . Contrast Media [Iodinated Diagnostic Agents]     Sob, tightness in throat  . Ramipril Cough    Social History   Socioeconomic History  . Marital status: Divorced    Spouse name: Not on file  . Number of children: 2  . Years of education: Not on file  . Highest education level: Not on file  Occupational History  . Occupation: Buyer, retail  Tobacco Use  . Smoking status: Never Smoker  . Smokeless tobacco: Never Used  Substance and Sexual Activity  . Alcohol use: Yes    Comment: 1-2 drinks per week  . Drug use: No  . Sexual activity: Not on file  Other Topics Concern  . Not on file  Social History Narrative  . Not on file   Social Determinants of Health   Financial Resource Strain:   . Difficulty of Paying Living Expenses: Not on file  Food Insecurity:   . Worried About Charity fundraiser in the Last Year: Not on file  . Ran Out  of Food in the Last Year: Not on file  Transportation Needs:   . Lack of Transportation (Medical): Not on file  . Lack of Transportation (Non-Medical): Not on file  Physical Activity:   . Days of Exercise per Week: Not on file  . Minutes of Exercise per Session: Not on file  Stress:   . Feeling of Stress : Not on file  Social Connections:   . Frequency of Communication with Friends and Family: Not on file  . Frequency of Social Gatherings with Friends and Family: Not on file  . Attends Religious Services: Not on file  . Active Member of Clubs or Organizations: Not on file  . Attends Archivist Meetings: Not on file  .  Marital Status: Not on file  Intimate Partner Violence:   . Fear of Current or Ex-Partner: Not on file  . Emotionally Abused: Not on file  . Physically Abused: Not on file  . Sexually Abused: Not on file     Review of Systems: General: negative for chills, fever, night sweats or weight changes.  Cardiovascular: negative for chest pain, dyspnea on exertion, edema, orthopnea, palpitations, paroxysmal nocturnal dyspnea or shortness of breath Dermatological: negative for rash Respiratory: negative for cough or wheezing Urologic: negative for hematuria Abdominal: negative for nausea, vomiting, diarrhea, bright red blood per rectum, melena, or hematemesis Neurologic: negative for visual changes, syncope, or dizziness All other systems reviewed and are otherwise negative except as noted above.    Blood pressure 136/72, pulse 68, height 5\' 9"  (1.753 m), weight 187 lb 6.4 oz (85 kg), SpO2 99 %.  General appearance: alert and no distress Neck: no adenopathy, no carotid bruit, no JVD, supple, symmetrical, trachea midline and thyroid not enlarged, symmetric, no tenderness/mass/nodules Lungs: clear to auscultation bilaterally Heart: regular rate and rhythm, S1, S2 normal, no murmur, click, rub or gallop Extremities: extremities normal, atraumatic, no cyanosis or edema Pulses: 2+ and symmetric Skin: Skin color, texture, turgor normal. No rashes or lesions Neurologic: Alert and oriented X 3, normal strength and tone. Normal symmetric reflexes. Normal coordination and gait  EKG sinus rhythm at 68 without ST or T wave changes.  I personally reviewed this EKG.  ASSESSMENT AND PLAN:   Coronary artery disease History of CAD status post anterior STEMI 04/30/2011 at 4:00 in the afternoon.  He had a proximal LAD occlusion which I stented with a 3.5 mm x 18 mm long MultiLink vision bare-metal stent.  His EF was 30% at that time.  Follow-up echo revealed normalization of LV function and Myoview showed  complete salvage myocardium without evidence of scar.  He is very active and denies chest pain or shortness of breath.  He did have a subsequent cardiac catheterization 04/19/2015 by Dr. Ellyn Norris revealing a widely patent stent with no other significant CAD.  Hyperlipidemia History of hyperlipidemia on statin therapy with lipid profile performed 09/04/2019 revealing total cholesterol 134, LDL 70 and HDL of 50.  Essential hypertension History of essential hypertension a blood pressure measured today at 136/72.  He is on atenolol, losartan and hydrochlorothiazide.      Devin Harp MD FACP,FACC,FAHA, [facility] 06/30/2020 9:45 AM

## 2020-06-30 NOTE — Assessment & Plan Note (Signed)
History of hyperlipidemia on statin therapy with lipid profile performed 09/04/2019 revealing total cholesterol 134, LDL 70 and HDL of 50.

## 2020-06-30 NOTE — Patient Instructions (Signed)

## 2020-12-09 DIAGNOSIS — R350 Frequency of micturition: Secondary | ICD-10-CM | POA: Diagnosis not present

## 2021-01-26 ENCOUNTER — Other Ambulatory Visit: Payer: Self-pay

## 2021-01-26 ENCOUNTER — Encounter (INDEPENDENT_AMBULATORY_CARE_PROVIDER_SITE_OTHER): Payer: Self-pay | Admitting: Otolaryngology

## 2021-01-26 ENCOUNTER — Ambulatory Visit (INDEPENDENT_AMBULATORY_CARE_PROVIDER_SITE_OTHER): Payer: Medicare Other | Admitting: Otolaryngology

## 2021-01-26 VITALS — Temp 97.5°F

## 2021-01-26 DIAGNOSIS — J310 Chronic rhinitis: Secondary | ICD-10-CM | POA: Diagnosis not present

## 2021-01-26 DIAGNOSIS — J3489 Other specified disorders of nose and nasal sinuses: Secondary | ICD-10-CM

## 2021-01-26 DIAGNOSIS — H9313 Tinnitus, bilateral: Secondary | ICD-10-CM

## 2021-01-26 NOTE — Progress Notes (Signed)
HPI: Devin Norris is a 75 y.o. male who presents for evaluation of chronic nasal congestion which is worse at night.  He has had this for a number of years.  He does have history of allergies.  He also complains of some ringing in his ears that comes and goes.  He has previously tried Biomedical scientist but these did not seem to help.  Of note he had nasal surgery performed over 30 years ago.  He thought he might have polyps causing his nasal obstruction.  He feels like he breathes okay during the day but has more congestion at night.  Past Medical History:  Diagnosis Date  . CAD (coronary artery disease)   . Family history of heart disease   . History of nuclear stress test 07/28/2011   bruce protocol, f/up MI; normal, without evidence of scar  . Hyperlipidemia   . Hypothyroidism   . Renal stones    passed stone  . STEMI (ST elevation myocardial infarction) ([place]) 04/30/2011   anterior wall   Past Surgical History:  Procedure Laterality Date  . CARDIAC CATHETERIZATION N/A 04/19/2015   Procedure: Left Heart Cath and Coronary Angiography;  Surgeon: Leonie Man, MD;  Location: [facility];  Service: Cardiovascular;  Laterality: N/A;  . CORONARY ANGIOPLASTY WITH STENT PLACEMENT  04/30/2011   r/t anterior wall STEMI; 3.5x68mm Multi-Link Vision bare metal stent to LAD  . NASAL SEPTUM SURGERY     30 years ago  . TRANSTHORACIC ECHOCARDIOGRAM  07/28/2011   EF 50-55%, mild conc LVH; mild MR/TR; mild calcif of aortic valve leaflets   Social History   Socioeconomic History  . Marital status: Divorced    Spouse name: Not on file  . Number of children: 2  . Years of education: Not on file  . Highest education level: Not on file  Occupational History  . Occupation: Buyer, retail  Tobacco Use  . Smoking status: Never Smoker  . Smokeless tobacco: Never Used  Substance and Sexual Activity  . Alcohol use: Yes    Comment: 1-2 drinks per week  . Drug use: No  . Sexual activity:  Not on file  Other Topics Concern  . Not on file  Social History Narrative  . Not on file   Social Determinants of Health   Financial Resource Strain: Not on file  Food Insecurity: Not on file  Transportation Needs: Not on file  Physical Activity: Not on file  Stress: Not on file  Social Connections: Not on file   Family History  Problem Relation Age of Onset  . Heart disease Mother   . Lung cancer Mother   . Heart disease Father   . Lung cancer Father   . Heart attack Father   . Hypertension Father   . Heart disease Brother   . Heart attack Brother   . Hypertension Brother   . Stroke Paternal Grandfather    Allergies  Allergen Reactions  . Contrast Media [Iodinated Diagnostic Agents]     Sob, tightness in throat  . Ramipril Cough   Prior to Admission medications   Medication Sig Start Date End Date Taking? Authorizing Provider  aspirin 81 MG tablet Take 81 mg by mouth daily.    [provider]  atenolol (TENORMIN) 50 MG tablet Take 50 mg by mouth daily.    [provider]  clonazePAM (KLONOPIN) 0.5 MG tablet Take 0.5 mg by mouth 2 (two) times daily as needed for anxiety.  [provider]  fluticasone (FLONASE) 50 MCG/ACT nasal spray Place 2 sprays into the nose daily as needed for rhinitis.    [provider]  levothyroxine (SYNTHROID, LEVOTHROID) 50 MCG tablet Take 50 mcg by mouth daily before breakfast.    [provider]  losartan-hydrochlorothiazide (HYZAAR) 100-12.5 MG tablet TAKE 1 TABLET BY MOUTH  DAILY 06/14/20   Lorretta Harp, MD  Multiple Vitamins-Minerals (ICAPS PO) Take 2 capsules by mouth daily.    [provider]  nitroGLYCERIN (NITROSTAT) 0.4 MG SL tablet Place 1 tablet (0.4 mg total) under the tongue every 5 (five) minutes as needed for chest pain. 07/01/19   Deberah Pelton, NP  rosuvastatin (CRESTOR) 20 MG tablet Take 20 mg by mouth daily.    [provider]     Positive ROS:  Otherwise negative  All other systems have been reviewed and were otherwise negative with the exception of those mentioned in the HPI and as above.  Physical Exam: Constitutional: Alert, well-appearing, no acute distress Ears: External ears without lesions or tenderness. Ear canals are clear bilaterally with intact, clear TMs bilaterally with no TM abnormality noted. Nasal: External nose without lesions. Septum is deviated to the right especially the bony septum more posteriorly.  After decongesting the nose nasal endoscopy was performed and on nasal endoscopy both middle meatus regions were clear.  The nasopharynx is clear..  No evidence of polyps.  No signs of infection. Oral: Lips and gums without lesions. Tongue and palate mucosa without lesions. Posterior oropharynx clear. Neck: No palpable adenopathy or masses Respiratory: Breathing comfortably  Skin: No facial/neck lesions or rash noted.  Procedures  Assessment: Chronic nasal congestion secondary to chronic rhinitis and septal deviation as well as turbinate per trophy.  No polyps noted. 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  Plan: Discussed with patient concerning regular use of nasal steroid spray and suggested using Nasacort 2 sprays each nostril at night as well as saline rinses during the day.  Discussed with him that there is no polyps within the nasal cavity.  At night suggested possibly trying Breathe Right strips or air max device that will help some with breathing at night. Could consider septoplasty and turbinate reductions if he does not get adequate relief with medical therapy. Discussed with him that tinnitus is likely related to some hearing loss and would recommend obtaining audiologic testing to evaluate tinnitus.  In the meantime recommended avoiding loud noise exposure and using ear protection when around loud noise.  I discussed with him concerning using masking noise to help with the tinnitus.  Radene Journey,  MD

## 2021-04-08 ENCOUNTER — Other Ambulatory Visit: Payer: Self-pay | Admitting: Cardiovascular Disease

## 2021-04-08 DIAGNOSIS — I10 Essential (primary) hypertension: Secondary | ICD-10-CM

## 2021-04-08 DIAGNOSIS — I255 Ischemic cardiomyopathy: Secondary | ICD-10-CM

## 2021-04-25 DIAGNOSIS — I10 Essential (primary) hypertension: Secondary | ICD-10-CM | POA: Diagnosis not present

## 2021-04-25 DIAGNOSIS — E039 Hypothyroidism, unspecified: Secondary | ICD-10-CM | POA: Diagnosis not present

## 2021-04-25 DIAGNOSIS — G4700 Insomnia, unspecified: Secondary | ICD-10-CM | POA: Diagnosis not present

## 2021-04-25 DIAGNOSIS — E785 Hyperlipidemia, unspecified: Secondary | ICD-10-CM | POA: Diagnosis not present

## 2021-08-09 ENCOUNTER — Ambulatory Visit: Payer: Medicare Other | Admitting: Cardiovascular Disease

## 2021-08-09 ENCOUNTER — Other Ambulatory Visit: Payer: Self-pay

## 2021-08-09 ENCOUNTER — Encounter: Payer: Self-pay | Admitting: Cardiovascular Disease

## 2021-08-09 VITALS — BP 134/86 | HR 65 | Ht 69.0 in | Wt 186.0 lb

## 2021-08-09 DIAGNOSIS — I10 Essential (primary) hypertension: Secondary | ICD-10-CM | POA: Diagnosis not present

## 2021-08-09 DIAGNOSIS — E782 Mixed hyperlipidemia: Secondary | ICD-10-CM | POA: Diagnosis not present

## 2021-08-09 DIAGNOSIS — I2510 Atherosclerotic heart disease of native coronary artery without angina pectoris: Secondary | ICD-10-CM | POA: Diagnosis not present

## 2021-08-09 NOTE — Assessment & Plan Note (Signed)
History of hyperlipidemia on statin therapy with lipid profile performed 10/28/2020 revealing total cholesterol 124, LDL 67 and HDL 43.

## 2021-08-09 NOTE — Progress Notes (Signed)
08/09/2021 Devin Norris   1946/05/05  [id number]  Primary Physician London Pepper, MD Primary Cardiologist: Lorretta Harp MD FACP, [place], [place], [place]  HPI:  Devin Norris is a 75 y.o.  mildly overweight divorced Caucasian male, father of 2, grandfather to 2 grandchildren, who I last saw in the office for care 06/30/2020.[name] He worked as an Buyer, retail and retired TO 2016. He is fairly active and still plays tennis, as well as working out 3 times per week.[name] His risk factors include a strong family history for heart disease. He suffered an anterior STEMI, April 30, 2011, at 4 o'clock in the afternoon, with an occluded proximal LAD, which I stented using a 3.5 x 18 mm long Multi-Link Vision bare-metal stent. His EF was 30% at that time with anteroapical akinesia. Peak CPK MB was 70 with a troponin of 21. His other epicardial arteries were normal. A followup echocardiogram and Myoview performed several months later revealed normal LV systolic function with complete salvage of myocardium and no evidence of scar. He had recurrent chest pain last month and underwent radial crit catheterization on 04/19/15 by Dr. Ellyn Hack revealing a widely patent stent with no significant CAD and normal LV function. He has had no recurrent symptoms. His lipid profile is followed by his primary care physician.    Since I saw him a year ago he continues to do well.  He denies chest pain or shortness of breath.  He still fairly active and plays tennis without symptoms.  Current Meds  Medication Sig   aspirin 81 MG tablet Take 81 mg by mouth daily.   atenolol (TENORMIN) 50 MG tablet Take 50 mg by mouth daily.   clonazePAM (KLONOPIN) 0.5 MG tablet Take 0.5 mg by mouth 2 (two) times daily as needed for anxiety.   fluticasone (FLONASE) 50 MCG/ACT nasal spray Place 2 sprays into the nose daily as needed for rhinitis.   levothyroxine (SYNTHROID, LEVOTHROID) 50 MCG tablet Take 50 mcg by mouth daily before breakfast.    losartan-hydrochlorothiazide (HYZAAR) 100-12.5 MG tablet TAKE 1 TABLET BY MOUTH  DAILY   Multiple Vitamins-Minerals (ICAPS PO) Take 2 capsules by mouth daily.   nitroGLYCERIN (NITROSTAT) 0.4 MG SL tablet Place 1 tablet (0.4 mg total) under the tongue every 5 (five) minutes as needed for chest pain.   rosuvastatin (CRESTOR) 20 MG tablet Take 20 mg by mouth daily.     Allergies  Allergen Reactions   Contrast Media [Iodinated Diagnostic Agents]     Sob, tightness in throat   Ramipril Cough    Social History   Socioeconomic History   Marital status: Divorced    Spouse name: Not on file   Number of children: 2   Years of education: Not on file   Highest education level: Not on file  Occupational History   Occupation: Buyer, retail  Tobacco Use   Smoking status: Never   Smokeless tobacco: Never  Substance and Sexual Activity   Alcohol use: Yes    Comment: 1-2 drinks per week   Drug use: No   Sexual activity: Not on file  Other Topics Concern   Not on file  Social History Narrative   Not on file   Social Determinants of Health   Financial Resource Strain: Not on file  Food Insecurity: Not on file  Transportation Needs: Not on file  Physical Activity: Not on file  Stress: Not on file  Social Connections: Not on file  Intimate Partner  Violence: Not on file     Review of Systems: General: negative for chills, fever, night sweats or weight changes.  Cardiovascular: negative for chest pain, dyspnea on exertion, edema, orthopnea, palpitations, paroxysmal nocturnal dyspnea or shortness of breath Dermatological: negative for rash Respiratory: negative for cough or wheezing Urologic: negative for hematuria Abdominal: negative for nausea, vomiting, diarrhea, bright red blood per rectum, melena, or hematemesis Neurologic: negative for visual changes, syncope, or dizziness All other systems reviewed and are otherwise negative except as noted above.    Blood pressure  134/86, pulse 65, height 5\' 9"  (1.753 m), weight 186 lb (84.4 kg).  General appearance: alert and no distress Neck: no adenopathy, no carotid bruit, no JVD, supple, symmetrical, trachea midline, and thyroid not enlarged, symmetric, no tenderness/mass/nodules Lungs: clear to auscultation bilaterally Heart: regular rate and rhythm, S1, S2 normal, no murmur, click, rub or gallop Extremities: extremities normal, atraumatic, no cyanosis or edema Pulses: 2+ and symmetric Skin: Skin color, texture, turgor normal. No rashes or lesions Neurologic: Grossly normal  EKG sinus rhythm at 65 without ST or T wave changes.  Personally reviewed this EKG.  ASSESSMENT AND PLAN:   Coronary artery disease History of CAD status post anterior STEMI 04/30/2011 at 4:00 in the afternoon.  I took him to the Cath Lab and stented his proximal LAD with a 3.5 x 18 mm long MultiLink vision bare-metal stent.  His EF at that time was 30% with anteroapical akinesia.  His MB was 70.  Follow-up echocardiogram and Myoview performed several months later were normal with complete salvage and no scar.  He was restudied by Dr. Ellyn Hack because of recurrent symptoms 04/19/2015 revealing widely patent stent with no other significant CAD and normal LV function.  He is very active and denies chest pain or shortness of breath.  Hyperlipidemia History of hyperlipidemia on statin therapy with lipid profile performed 10/28/2020 revealing total cholesterol 124, LDL 67 and HDL 43.  Essential hypertension History of essential hypertension a blood pressure measured today 134/86.  He is on atenolol, losartan and hydrochlorothiazide.     Lorretta Harp MD FACP,FACC,FAHA, [facility] 08/09/2021 1:35 PM

## 2021-08-09 NOTE — Assessment & Plan Note (Signed)
History of CAD status post anterior STEMI 04/30/2011 at 4:00 in the afternoon.  I took him to the Cath Lab and stented his proximal LAD with a 3.5 x 18 mm long MultiLink vision bare-metal stent.  His EF at that time was 30% with anteroapical akinesia.  His MB was 70.  Follow-up echocardiogram and Myoview performed several months later were normal with complete salvage and no scar.  He was restudied by Dr. Ellyn Hack because of recurrent symptoms 04/19/2015 revealing widely patent stent with no other significant CAD and normal LV function.  He is very active and denies chest pain or shortness of breath.

## 2021-08-09 NOTE — Patient Instructions (Signed)

## 2021-08-09 NOTE — Assessment & Plan Note (Signed)
History of essential hypertension a blood pressure measured today 134/86.  He is on atenolol, losartan and hydrochlorothiazide.

## 2021-08-17 DIAGNOSIS — H353131 Nonexudative age-related macular degeneration, bilateral, early dry stage: Secondary | ICD-10-CM | POA: Diagnosis not present

## 2021-08-17 DIAGNOSIS — H40053 Ocular hypertension, bilateral: Secondary | ICD-10-CM | POA: Diagnosis not present

## 2021-08-17 DIAGNOSIS — H40023 Open angle with borderline findings, high risk, bilateral: Secondary | ICD-10-CM | POA: Diagnosis not present

## 2021-08-17 DIAGNOSIS — H524 Presbyopia: Secondary | ICD-10-CM | POA: Diagnosis not present

## 2021-08-17 DIAGNOSIS — H2513 Age-related nuclear cataract, bilateral: Secondary | ICD-10-CM | POA: Diagnosis not present

## 2021-08-26 ENCOUNTER — Encounter: Payer: Self-pay | Admitting: Podiatry

## 2021-08-26 ENCOUNTER — Other Ambulatory Visit: Payer: Self-pay

## 2021-08-26 ENCOUNTER — Ambulatory Visit (INDEPENDENT_AMBULATORY_CARE_PROVIDER_SITE_OTHER): Payer: Medicare Other

## 2021-08-26 ENCOUNTER — Ambulatory Visit: Payer: Medicare Other | Admitting: Podiatry

## 2021-08-26 ENCOUNTER — Other Ambulatory Visit: Payer: Self-pay | Admitting: Podiatry

## 2021-08-26 DIAGNOSIS — M79671 Pain in right foot: Secondary | ICD-10-CM | POA: Diagnosis not present

## 2021-08-26 DIAGNOSIS — M7671 Peroneal tendinitis, right leg: Secondary | ICD-10-CM | POA: Diagnosis not present

## 2021-08-26 DIAGNOSIS — M722 Plantar fascial fibromatosis: Secondary | ICD-10-CM

## 2021-08-26 MED ORDER — TRIAMCINOLONE ACETONIDE 10 MG/ML IJ SUSP
10.0000 mg | Freq: Once | INTRAMUSCULAR | Status: AC
Start: 2021-08-26 — End: 2021-08-26
  Administered 2021-08-26: 10 mg

## 2021-08-29 NOTE — Progress Notes (Signed)
Subjective:   Patient ID: Devin Norris, male   DOB: 75 y.o.   MRN: [id number]   HPI Patient presents stating he developed a lot of pain in the outside of his right foot for the last approximate month.  Does not remember specific injury and states it is gradually becoming more of an issue and states it aches at all times.  Patient does not smoke and does like to be active and this is really impeding him   Review of Systems  All other systems reviewed and are negative.      Objective:  Physical Exam Vitals and nursing note reviewed.  Constitutional:      Appearance: He is well-developed.  Pulmonary:     Effort: Pulmonary effort is normal.  Musculoskeletal:        General: Normal range of motion.  Skin:    General: Skin is warm.  Neurological:     Mental Status: He is alert.    Neurovascular status found to be intact muscle strength found to be adequate range of motion adequate.  Patient is found to have inflammation fluid around the base of the fifth metatarsal right that is painful when pressed and make shoe gear difficult with moderate swelling around the area and no indications of muscle tendon dysfunction.  Patient has good digital perfusion well oriented x3     Assessment:  Inflammatory tendinitis of the peroneal tendon at base of fifth metatarsal insertion     Plan:  H&P reviewed condition and discussed the possibility for bone injury and muscle tendon injury.  Today I went ahead x-rayed and then did sterile prep and injected the insertion right 3 mg dexamethasone Kenalog 5 mg Xylocaine applied fascial brace to lift up the lateral side of the foot along with aggressive ice therapy.  Gave instructions for rigid bottom shoes reappoint  X-rays indicate reactivity of the base of the fifth metatarsal right no indications fracture associated with condition

## 2021-11-16 DIAGNOSIS — G4700 Insomnia, unspecified: Secondary | ICD-10-CM | POA: Diagnosis not present

## 2021-11-16 DIAGNOSIS — E039 Hypothyroidism, unspecified: Secondary | ICD-10-CM | POA: Diagnosis not present

## 2021-11-16 DIAGNOSIS — Z0000 Encounter for general adult medical examination without abnormal findings: Secondary | ICD-10-CM | POA: Diagnosis not present

## 2021-11-16 DIAGNOSIS — R350 Frequency of micturition: Secondary | ICD-10-CM | POA: Diagnosis not present

## 2021-11-16 DIAGNOSIS — R109 Unspecified abdominal pain: Secondary | ICD-10-CM | POA: Diagnosis not present

## 2021-11-16 DIAGNOSIS — I10 Essential (primary) hypertension: Secondary | ICD-10-CM | POA: Diagnosis not present

## 2021-11-16 DIAGNOSIS — E785 Hyperlipidemia, unspecified: Secondary | ICD-10-CM | POA: Diagnosis not present

## 2021-12-14 ENCOUNTER — Encounter: Payer: Self-pay | Admitting: Podiatry

## 2021-12-14 ENCOUNTER — Other Ambulatory Visit: Payer: Self-pay

## 2021-12-14 ENCOUNTER — Ambulatory Visit: Payer: Medicare Other | Admitting: Podiatry

## 2021-12-14 DIAGNOSIS — M7671 Peroneal tendinitis, right leg: Secondary | ICD-10-CM | POA: Diagnosis not present

## 2021-12-14 MED ORDER — TRIAMCINOLONE ACETONIDE 10 MG/ML IJ SUSP
10.0000 mg | Freq: Once | INTRAMUSCULAR | Status: AC
Start: 2021-12-14 — End: 2021-12-14
  Administered 2021-12-14: 10 mg

## 2021-12-18 NOTE — Progress Notes (Signed)
Subjective:   Patient ID: Devin Norris, male   DOB: 76 y.o.   MRN: [id number]   HPI Patient presents stating that the right foot has started to hurt again and it did very well for a number of months   ROS      Objective:  Physical Exam  Neurovascular status intact with inflammation of the right peroneal insertion with inflammation fluid at that insertion not to the same degree as what we have seen previously.     Assessment:  Peritoneal tendinitis right with inflammation     Plan:  Standard prep and did go ahead today after educating him on risk about rupture and injected 3 mg dexamethasone Kenalog 5 mg Xylocaine and sterile dressing applied.  Advised on reduced activity and wearing supportive shoes

## 2021-12-27 DIAGNOSIS — R140 Abdominal distension (gaseous): Secondary | ICD-10-CM | POA: Diagnosis not present

## 2022-01-03 DIAGNOSIS — R140 Abdominal distension (gaseous): Secondary | ICD-10-CM | POA: Diagnosis not present

## 2022-01-09 DIAGNOSIS — E039 Hypothyroidism, unspecified: Secondary | ICD-10-CM | POA: Diagnosis not present

## 2022-01-24 DIAGNOSIS — D128 Benign neoplasm of rectum: Secondary | ICD-10-CM | POA: Diagnosis not present

## 2022-01-24 DIAGNOSIS — K5730 Diverticulosis of large intestine without perforation or abscess without bleeding: Secondary | ICD-10-CM | POA: Diagnosis not present

## 2022-01-24 DIAGNOSIS — Z86010 Personal history of colonic polyps: Secondary | ICD-10-CM | POA: Diagnosis not present

## 2022-01-26 DIAGNOSIS — D128 Benign neoplasm of rectum: Secondary | ICD-10-CM | POA: Diagnosis not present

## 2022-02-14 ENCOUNTER — Other Ambulatory Visit: Payer: Self-pay | Admitting: Cardiovascular Disease

## 2022-02-14 DIAGNOSIS — I10 Essential (primary) hypertension: Secondary | ICD-10-CM

## 2022-02-14 DIAGNOSIS — I255 Ischemic cardiomyopathy: Secondary | ICD-10-CM

## 2022-03-21 DIAGNOSIS — J029 Acute pharyngitis, unspecified: Secondary | ICD-10-CM | POA: Diagnosis not present

## 2022-03-21 DIAGNOSIS — Z20822 Contact with and (suspected) exposure to covid-19: Secondary | ICD-10-CM | POA: Diagnosis not present

## 2022-03-21 DIAGNOSIS — R062 Wheezing: Secondary | ICD-10-CM | POA: Diagnosis not present

## 2022-04-12 DIAGNOSIS — R0789 Other chest pain: Secondary | ICD-10-CM | POA: Diagnosis not present

## 2022-04-12 DIAGNOSIS — I10 Essential (primary) hypertension: Secondary | ICD-10-CM | POA: Diagnosis not present

## 2022-04-12 DIAGNOSIS — R079 Chest pain, unspecified: Secondary | ICD-10-CM | POA: Diagnosis not present

## 2022-04-17 ENCOUNTER — Ambulatory Visit (HOSPITAL_BASED_OUTPATIENT_CLINIC_OR_DEPARTMENT_OTHER): Payer: Medicare Other | Admitting: Family

## 2022-04-17 ENCOUNTER — Encounter (HOSPITAL_BASED_OUTPATIENT_CLINIC_OR_DEPARTMENT_OTHER): Payer: Self-pay | Admitting: Family

## 2022-04-17 VITALS — BP 118/72 | HR 67 | Ht 69.0 in | Wt 192.0 lb

## 2022-04-17 DIAGNOSIS — I10 Essential (primary) hypertension: Secondary | ICD-10-CM

## 2022-04-17 DIAGNOSIS — E785 Hyperlipidemia, unspecified: Secondary | ICD-10-CM

## 2022-04-17 DIAGNOSIS — I25118 Atherosclerotic heart disease of native coronary artery with other forms of angina pectoris: Secondary | ICD-10-CM

## 2022-04-17 DIAGNOSIS — I2510 Atherosclerotic heart disease of native coronary artery without angina pectoris: Secondary | ICD-10-CM | POA: Diagnosis not present

## 2022-04-17 MED ORDER — NITROGLYCERIN 0.4 MG SL SUBL: 0.4000 mg | SUBLINGUAL_TABLET | SUBLINGUAL | 4 refills | Status: AC | PRN

## 2022-04-17 NOTE — Progress Notes (Signed)
Office Visit    Patient Name: Devin Norris Date of Encounter: 04/17/2022  PCP:  London Pepper, [place]  Cardiologist:  Quay Burow, MD  Advanced Practice Provider:  No care team member to display Electrophysiologist:  None      Chief Complaint    Devin Norris is a 76 y.o. male with a hx of CAD, hypothyroidism, HTN presents today for chest pain   Past Medical History    Past Medical History:  Diagnosis Date   CAD (coronary artery disease)    Family history of heart disease    History of nuclear stress test 07/28/2011   bruce protocol, f/up MI; normal, without evidence of scar   Hyperlipidemia    Hypothyroidism    Renal stones    passed stone   STEMI (ST elevation myocardial infarction) ([place]) 04/30/2011   anterior wall   Past Surgical History:  Procedure Laterality Date   CARDIAC CATHETERIZATION N/A 04/19/2015   Procedure: Left Heart Cath and Coronary Angiography;  Surgeon: Leonie Man, MD;  Location: [facility];  Service: Cardiovascular;  Laterality: N/A;   CORONARY ANGIOPLASTY WITH STENT PLACEMENT  04/30/2011   r/t anterior wall STEMI; 3.5x49m Multi-Link Vision bare metal stent to LAD   NASAL SEPTUM SURGERY     30 years ago   TRANSTHORACIC ECHOCARDIOGRAM  07/28/2011   EF 50-55%, mild conc LVH; mild MR/TR; mild calcif of aortic valve leaflets    Allergies  Allergies  Allergen Reactions   Contrast Media [Iodinated Contrast Media]     Sob, tightness in throat   Ramipril Cough    History of Present Illness    Devin Norris a 76y.o. male with a hx of CAD, hypothyroidism, HTN last seen 08/09/21.  Last seen 08/2021 doing well from a cardiac perspective with no anginal symptoms.   He presents today for follow up independently. Last Wednesday started having a mid-sternal chest pain which he describes as "deep". Hurt from early morning until 4pm. No nausea. Called ems as he was concerned related to his prior  cardiac history.  EMS EKG reviewed showing normal sinus rhythm with no acute ST/T wave changes.  Does note that symptoms felt different thatn his previous anginal equivalent. BP by EMS 190 then 160/80. He had respiratory virus a few weeks ago with cough that took a long time to heal and eventually was on Z-pack.  Wonders whether chest discomfort was related to prior respiratory symptoms.  He did take a Tums but it did not improve symptoms.  He has had no recurrent symptoms since that time.  He stays very active playing tennis once per week, walking 3 to 4 miles twice per week, and doing his own yard work.    EKGs/Labs/Other Studies Reviewed:   The following studies were reviewed today:  EKG:  EKG is ordered today.  The ekg ordered today demonstrates NSR 67 bpm with no acute ST/T wave changes.   Recent Labs: No results found for requested labs within last 365 days.  Recent Lipid Panel    Component Value Date/Time   CHOL 128 10/06/2014 1001   TRIG 67 10/06/2014 1001   HDL 48 10/06/2014 1001   CHOLHDL 2.7 10/06/2014 1001   VLDL 13 10/06/2014 1001   LDLCALC 67 10/06/2014 1001   Home Medications   Current Meds  Medication Sig   aspirin 81 MG tablet Take 81 mg by mouth daily.   atenolol (  TENORMIN) 50 MG tablet Take 50 mg by mouth daily.   clonazePAM (KLONOPIN) 0.5 MG tablet Take 0.5 mg by mouth 2 (two) times daily as needed for anxiety.   fluticasone (FLONASE) 50 MCG/ACT nasal spray Place 2 sprays into the nose daily as needed for rhinitis.   levothyroxine (SYNTHROID, LEVOTHROID) 50 MCG tablet Take 50 mcg by mouth daily before breakfast.   losartan-hydrochlorothiazide (HYZAAR) 100-12.5 MG tablet TAKE 1 TABLET BY MOUTH  DAILY   Multiple Vitamins-Minerals (ICAPS PO) Take 2 capsules by mouth daily.   rosuvastatin (CRESTOR) 20 MG tablet Take 20 mg by mouth daily.     Review of Systems      All other systems reviewed and are otherwise negative except as noted above.  Physical Exam     VS:  BP 118/72   Pulse 67   Ht '5\' 9"'$  (1.753 m)   Wt 192 lb (87.1 kg)   BMI 28.35 kg/m  , BMI Body mass index is 28.35 kg/m.  Wt Readings from Last 3 Encounters:  04/17/22 192 lb (87.1 kg)  08/09/21 186 lb (84.4 kg)  06/30/20 187 lb 6.4 oz (85 kg)    GEN: Well nourished, well developed, in no acute distress. HEENT: normal. Neck: Supple, no JVD, carotid bruits, or masses. Cardiac: RRR, no murmurs, rubs, or gallops. No clubbing, cyanosis, edema.  Radials/PT 2+ and equal bilaterally.  Respiratory:  Respirations regular and unlabored, clear to auscultation bilaterally. GI: Soft, nontender, nondistended. MS: No deformity or atrophy. Skin: Warm and dry, no rash. Neuro:  Strength and sensation are intact. Psych: Normal affect.  Assessment & Plan    CAD - Prior anterior STEMI 04/29/21 with stenting to prox LAD. EF 30%. F/u echo and myoview several months later normal. Cath 04/2015 patent stent and no significant CAD.  Isolated episode of chest discomfort that occurred at rest in the setting of viral illness.  Likely episode pleuritic.  He has had no recurrence and remains active playing tennis, walking 3 to 4 miles without anginal symptoms.  EKG today no acute ST/T wave changes.  No indication for ischemic evaluation.  Continue GDMT include aspirin, atenolol, rosuvastatin.  Updated Rx for as needed nitroglycerin provided.  HLD - 11/2021 LDL 66. Continue Rosuvastatin '20mg'$  daily.   HTN -BP well controlled. Continue current antihypertensive regimen.    Disposition: Follow up  in October  with Quay Burow, MD   Signed, Loel Dubonnet, NP 04/17/2022, 2:36 PM [place]

## 2022-04-17 NOTE — Patient Instructions (Signed)
Medication Instructions:  Your Physician recommend you continue on your current medication as directed.    We have refilled your Nitroglycerin   *If you need a refill on your cardiac medications before your next appointment, please call your pharmacy*   Lab Work: None ordered today   Testing/Procedures: None ordered today    Follow-Up: At [facility], you and your health needs are our priority.  As part of our continuing mission to provide you with exceptional heart care, we have created designated Provider Care Teams.  These Care Teams include your primary Cardiologist (physician) and Advanced Practice Providers (APPs -  Physician Assistants and Nurse Practitioners) who all work together to provide you with the care you need, when you need it.  We recommend signing up for the patient portal called "MyChart".  Sign up information is provided on this After Visit Summary.  MyChart is used to connect with patients for Virtual Visits (Telemedicine).  Patients are able to view lab/test results, encounter notes, upcoming appointments, etc.  Non-urgent messages can be sent to your provider as well.   To learn more about what you can do with MyChart, go to [email or web address].    Your next appointment:   Follow up as scheduled   Other Instructions Heart Healthy Diet Recommendations: A low-salt diet is recommended. Meats should be grilled, baked, or boiled. Avoid fried foods. Focus on lean protein sources like fish or chicken with vegetables and fruits. The American Heart Association is a Microbiologist!  American Heart Association Diet and Lifeystyle Recommendations   Exercise recommendations: The American Heart Association recommends 150 minutes of moderate intensity exercise weekly. Try 30 minutes of moderate intensity exercise 4-5 times per week. This could include walking, jogging, or swimming.   Important Information About Sugar

## 2022-04-18 NOTE — Addendum Note (Signed)
Addended by: Gerald Stabs on: 04/18/2022 07:50 AM   Modules accepted: Orders

## 2022-05-19 DIAGNOSIS — E785 Hyperlipidemia, unspecified: Secondary | ICD-10-CM | POA: Diagnosis not present

## 2022-05-19 DIAGNOSIS — I10 Essential (primary) hypertension: Secondary | ICD-10-CM | POA: Diagnosis not present

## 2022-05-19 DIAGNOSIS — E039 Hypothyroidism, unspecified: Secondary | ICD-10-CM | POA: Diagnosis not present

## 2022-05-19 DIAGNOSIS — G4700 Insomnia, unspecified: Secondary | ICD-10-CM | POA: Diagnosis not present

## 2022-06-26 DIAGNOSIS — M25561 Pain in right knee: Secondary | ICD-10-CM | POA: Diagnosis not present

## 2022-06-26 DIAGNOSIS — M25562 Pain in left knee: Secondary | ICD-10-CM | POA: Diagnosis not present

## 2022-07-26 DIAGNOSIS — K120 Recurrent oral aphthae: Secondary | ICD-10-CM | POA: Diagnosis not present

## 2022-08-09 ENCOUNTER — Ambulatory Visit: Payer: Medicare Other | Attending: Cardiovascular Disease | Admitting: Cardiovascular Disease

## 2022-08-09 ENCOUNTER — Encounter: Payer: Self-pay | Admitting: Cardiovascular Disease

## 2022-08-09 DIAGNOSIS — I10 Essential (primary) hypertension: Secondary | ICD-10-CM | POA: Diagnosis not present

## 2022-08-09 DIAGNOSIS — I2510 Atherosclerotic heart disease of native coronary artery without angina pectoris: Secondary | ICD-10-CM | POA: Diagnosis not present

## 2022-08-09 DIAGNOSIS — E782 Mixed hyperlipidemia: Secondary | ICD-10-CM

## 2022-08-09 NOTE — Assessment & Plan Note (Signed)
History of hyperlipidemia on rosuvastatin with lipid profile performed 11/16/2021 revealing total cholesterol 123, LDL 66 and HDL 44.

## 2022-08-09 NOTE — Patient Instructions (Signed)
Medication Instructions:  Your physician recommends that you continue on your current medications as directed. Please refer to the Current Medication list given to you today.  *If you need a refill on your cardiac medications before your next appointment, please call your pharmacy*   Follow-Up: At [facility], you and your health needs are our priority.  As part of our continuing mission to provide you with exceptional heart care, we have created designated Provider Care Teams.  These Care Teams include your primary Cardiologist (physician) and Advanced Practice Providers (APPs -  Physician Assistants and Nurse Practitioners) who all work together to provide you with the care you need, when you need it.  We recommend signing up for the patient portal called "MyChart".  Sign up information is provided on this After Visit Summary.  MyChart is used to connect with patients for Virtual Visits (Telemedicine).  Patients are able to view lab/test results, encounter notes, upcoming appointments, etc.  Non-urgent messages can be sent to your provider as well.   To learn more about what you can do with MyChart, go to [email or web address].    Your next appointment:   12 month(s)  The format for your next appointment:   In Person  Provider:   Jonathan Berry, MD   

## 2022-08-09 NOTE — Progress Notes (Signed)
08/09/2022 Devin Norris   10/07/1946  [id number]  Primary Physician London Pepper, MD Primary Cardiologist: Lorretta Harp MD FACP, [place], [place], [place]  HPI:  Devin Norris is a 76 y.o.    mildly overweight divorced Caucasian male, father of 2, grandfather to 2 grandchildren, who I last saw in the office for care 10//22.[name] He worked as an Buyer, retail and retired TO 2016. He is fairly active and still plays tennis, as well as working out 3 times per week.[name] His risk factors include a strong family history for heart disease. He suffered an anterior STEMI, April 30, 2011, at 4 o'clock in the afternoon, with an occluded proximal LAD, which I stented using a 3.5 x 18 mm long Multi-Link Vision bare-metal stent. His EF was 30% at that time with anteroapical akinesia. Peak CPK MB was 70 with a troponin of 21. His other epicardial arteries were normal. A followup echocardiogram and Myoview performed several months later revealed normal LV systolic function with complete salvage of myocardium and no evidence of scar. He had recurrent chest pain last month and underwent radial crit catheterization on 04/19/15 by Dr. Ellyn Hack revealing a widely patent stent with no significant CAD and normal LV function. He has had no recurrent symptoms. His lipid profile is followed by his primary care physician.    Since I saw him a year ago he continues to do well.  He had 1 episode of chest pain for which he saw Laurann Montana, NP 04/17/2022 which did not sound anginal.  He denies chest pain or shortness of breath otherwise.  He still fairly active and plays tennis without symptoms.   Current Meds  Medication Sig   aspirin 81 MG tablet Take 81 mg by mouth daily.   atenolol (TENORMIN) 50 MG tablet Take 50 mg by mouth daily.   clonazePAM (KLONOPIN) 0.5 MG tablet Take 0.5 mg by mouth 2 (two) times daily as needed for anxiety.   fluticasone (FLONASE) 50 MCG/ACT nasal spray Place 2 sprays into the nose daily as  needed for rhinitis.   levothyroxine (SYNTHROID, LEVOTHROID) 50 MCG tablet Take 50 mcg by mouth daily before breakfast.   losartan-hydrochlorothiazide (HYZAAR) 100-12.5 MG tablet TAKE 1 TABLET BY MOUTH  DAILY   Multiple Vitamins-Minerals (ICAPS PO) Take 2 capsules by mouth daily.   nitroGLYCERIN (NITROSTAT) 0.4 MG SL tablet Place 1 tablet (0.4 mg total) under the tongue every 5 (five) minutes as needed for chest pain.   rosuvastatin (CRESTOR) 20 MG tablet Take 20 mg by mouth daily.     Allergies  Allergen Reactions   Contrast Media [Iodinated Contrast Media]     Sob, tightness in throat   Ramipril Cough    Social History   Socioeconomic History   Marital status: Divorced    Spouse name: Not on file   Number of children: 2   Years of education: Not on file   Highest education level: Not on file  Occupational History   Occupation: Buyer, retail  Tobacco Use   Smoking status: Never   Smokeless tobacco: Never  Substance and Sexual Activity   Alcohol use: Yes    Comment: 1-2 drinks per week   Drug use: No   Sexual activity: Not on file  Other Topics Concern   Not on file  Social History Narrative   Not on file   Social Determinants of Health   Financial Resource Strain: Not on file  Food Insecurity: Not on file  Transportation Needs: Not on file  Physical Activity: Not on file  Stress: Not on file  Social Connections: Not on file  Intimate Partner Violence: Not on file     Review of Systems: General: negative for chills, fever, night sweats or weight changes.  Cardiovascular: negative for chest pain, dyspnea on exertion, edema, orthopnea, palpitations, paroxysmal nocturnal dyspnea or shortness of breath Dermatological: negative for rash Respiratory: negative for cough or wheezing Urologic: negative for hematuria Abdominal: negative for nausea, vomiting, diarrhea, bright red blood per rectum, melena, or hematemesis Neurologic: negative for visual changes,  syncope, or dizziness All other systems reviewed and are otherwise negative except as noted above.    Blood pressure 136/72, pulse 70, height 5' 8.5" (1.74 m), weight 189 lb (85.7 kg).  General appearance: alert and no distress Neck: no adenopathy, no carotid bruit, no JVD, supple, symmetrical, trachea midline, and thyroid not enlarged, symmetric, no tenderness/mass/nodules Lungs: clear to auscultation bilaterally Heart: regular rate and rhythm, S1, S2 normal, no murmur, click, rub or gallop Extremities: extremities normal, atraumatic, no cyanosis or edema Pulses: 2+ and symmetric Skin: Skin color, texture, turgor normal. No rashes or lesions Neurologic: Grossly normal  EKG not performed today  ASSESSMENT AND PLAN:   Coronary artery disease History of CAD status post anterior STEMI 04/30/2011 at 4:00 the afternoon.  Took him to the Cath Lab revealing an occluded proximal LAD which I stented using a 3.5 mm x 18 mm long MultiLink vision bare-metal stent.  His EF at that time was 30% with anteroapical akinesia.  His peak CPK-MB was 70.  His other arteries were free of obstructive disease.  Follow-up echo and Myoview performed several months later showed normalization of his LV function with complete salvage of myocardium, no evidence of scar.  Dr. Ellyn Hack did perform cardiac catheterization on him because of recurrent chest pain 04/19/2015 revealing a widely patent stent with no significant CAD.  He had 1 episode of chest pain earlier this year that he attributed to a "viral illness".  He is very active and plays vigorous tennis without symptoms.  He remains on a baby aspirin.  Hyperlipidemia History of hyperlipidemia on rosuvastatin with lipid profile performed 11/16/2021 revealing total cholesterol 123, LDL 66 and HDL 44.  Essential hypertension History of essential hypertension a blood pressure measured today at 136/72.  He is on atenolol, losartan and hydrochlorothiazide.     Lorretta Harp MD FACP,FACC,FAHA, [facility] 08/09/2022 9:42 AM

## 2022-08-09 NOTE — Assessment & Plan Note (Signed)
History of essential hypertension a blood pressure measured today at 136/72.  He is on atenolol, losartan and hydrochlorothiazide.

## 2022-08-09 NOTE — Assessment & Plan Note (Signed)
History of CAD status post anterior STEMI 04/30/2011 at 4:00 the afternoon.  Took him to the Cath Lab revealing an occluded proximal LAD which I stented using a 3.5 mm x 18 mm long MultiLink vision bare-metal stent.  His EF at that time was 30% with anteroapical akinesia.  His peak CPK-MB was 70.  His other arteries were free of obstructive disease.  Follow-up echo and Myoview performed several months later showed normalization of his LV function with complete salvage of myocardium, no evidence of scar.  Dr. Ellyn Hack did perform cardiac catheterization on him because of recurrent chest pain 04/19/2015 revealing a widely patent stent with no significant CAD.  He had 1 episode of chest pain earlier this year that he attributed to a "viral illness".  He is very active and plays vigorous tennis without symptoms.  He remains on a baby aspirin.

## 2022-11-02 ENCOUNTER — Encounter: Payer: Self-pay | Admitting: Podiatry

## 2022-11-02 ENCOUNTER — Ambulatory Visit (INDEPENDENT_AMBULATORY_CARE_PROVIDER_SITE_OTHER): Payer: Medicare Other

## 2022-11-02 ENCOUNTER — Ambulatory Visit (INDEPENDENT_AMBULATORY_CARE_PROVIDER_SITE_OTHER): Payer: Medicare Other | Admitting: Podiatry

## 2022-11-02 DIAGNOSIS — I7300 Raynaud's syndrome without gangrene: Secondary | ICD-10-CM | POA: Diagnosis not present

## 2022-11-02 DIAGNOSIS — M7671 Peroneal tendinitis, right leg: Secondary | ICD-10-CM

## 2022-11-02 MED ORDER — TRIAMCINOLONE ACETONIDE 10 MG/ML IJ SUSP
10.0000 mg | Freq: Once | INTRAMUSCULAR | Status: AC
  Administered 2022-11-02: 10 mg

## 2022-11-02 NOTE — Progress Notes (Signed)
Patient states he is subjective:   Patient ID: Devin Norris, male   DOB: 76 y.o.   MRN: [id number]   HPI A lot of pain in his fourth toe right foot over the last 4 weeks but the outside of his foot has just started to hurt again.  Has 2 separate problems today   ROS      Objective:  Physical Exam  Neurovascular status was found to be intact mild coolness to the toes area of red splotchy distal lateral aspect digit 5 right painful when pressed with inflammation of the lateral side of the right foot at the peroneal insertion     Assessment:  Probability for mild Raynaud's condition right with cold exposure along with peroneal tendinitis secondarily     Plan:  H&P educated him on Raynaud's and the importance of avoidance of cold and proper shoe gear usage and sock usage.  Should heal uneventfully I did cushion it today and if any ulcer or occur he is to come back immediately today for the outside of the foot I did do sterile prep and injected the insertion peroneal 3 mg Dexasone Kenalog 5 mg Xylocaine sterile dressing applied tolerated well  X-rays did not indicate significant pathology fourth toe did indicate rotation of the toe no change in the fifth metatarsal base

## 2022-11-28 ENCOUNTER — Other Ambulatory Visit: Payer: Self-pay | Admitting: Cardiovascular Disease

## 2022-11-28 DIAGNOSIS — I10 Essential (primary) hypertension: Secondary | ICD-10-CM

## 2022-11-28 DIAGNOSIS — I255 Ischemic cardiomyopathy: Secondary | ICD-10-CM

## 2022-11-29 DIAGNOSIS — I10 Essential (primary) hypertension: Secondary | ICD-10-CM | POA: Diagnosis not present

## 2022-11-29 DIAGNOSIS — G4700 Insomnia, unspecified: Secondary | ICD-10-CM | POA: Diagnosis not present

## 2022-11-29 DIAGNOSIS — E039 Hypothyroidism, unspecified: Secondary | ICD-10-CM | POA: Diagnosis not present

## 2022-11-29 DIAGNOSIS — I700 Atherosclerosis of aorta: Secondary | ICD-10-CM | POA: Diagnosis not present

## 2022-11-29 DIAGNOSIS — E785 Hyperlipidemia, unspecified: Secondary | ICD-10-CM | POA: Diagnosis not present

## 2022-11-29 DIAGNOSIS — R109 Unspecified abdominal pain: Secondary | ICD-10-CM | POA: Diagnosis not present

## 2022-11-29 DIAGNOSIS — N1831 Chronic kidney disease, stage 3a: Secondary | ICD-10-CM | POA: Diagnosis not present

## 2022-11-29 DIAGNOSIS — R350 Frequency of micturition: Secondary | ICD-10-CM | POA: Diagnosis not present

## 2022-11-29 DIAGNOSIS — Z0000 Encounter for general adult medical examination without abnormal findings: Secondary | ICD-10-CM | POA: Diagnosis not present

## 2023-03-14 DIAGNOSIS — D2271 Melanocytic nevi of right lower limb, including hip: Secondary | ICD-10-CM | POA: Diagnosis not present

## 2023-03-14 DIAGNOSIS — D692 Other nonthrombocytopenic purpura: Secondary | ICD-10-CM | POA: Diagnosis not present

## 2023-03-14 DIAGNOSIS — L570 Actinic keratosis: Secondary | ICD-10-CM | POA: Diagnosis not present

## 2023-03-14 DIAGNOSIS — D2262 Melanocytic nevi of left upper limb, including shoulder: Secondary | ICD-10-CM | POA: Diagnosis not present

## 2023-03-14 DIAGNOSIS — D225 Melanocytic nevi of trunk: Secondary | ICD-10-CM | POA: Diagnosis not present

## 2023-03-14 DIAGNOSIS — Z85828 Personal history of other malignant neoplasm of skin: Secondary | ICD-10-CM | POA: Diagnosis not present

## 2023-03-14 DIAGNOSIS — D1801 Hemangioma of skin and subcutaneous tissue: Secondary | ICD-10-CM | POA: Diagnosis not present

## 2023-03-14 DIAGNOSIS — L821 Other seborrheic keratosis: Secondary | ICD-10-CM | POA: Diagnosis not present

## 2023-03-26 DIAGNOSIS — M170 Bilateral primary osteoarthritis of knee: Secondary | ICD-10-CM | POA: Diagnosis not present

## 2023-04-11 DIAGNOSIS — R1030 Lower abdominal pain, unspecified: Secondary | ICD-10-CM | POA: Diagnosis not present

## 2023-05-30 DIAGNOSIS — I10 Essential (primary) hypertension: Secondary | ICD-10-CM | POA: Diagnosis not present

## 2023-05-30 DIAGNOSIS — G4700 Insomnia, unspecified: Secondary | ICD-10-CM | POA: Diagnosis not present

## 2023-05-30 DIAGNOSIS — E785 Hyperlipidemia, unspecified: Secondary | ICD-10-CM | POA: Diagnosis not present

## 2023-05-30 DIAGNOSIS — R350 Frequency of micturition: Secondary | ICD-10-CM | POA: Diagnosis not present

## 2023-05-30 DIAGNOSIS — E039 Hypothyroidism, unspecified: Secondary | ICD-10-CM | POA: Diagnosis not present

## 2023-05-30 DIAGNOSIS — N1831 Chronic kidney disease, stage 3a: Secondary | ICD-10-CM | POA: Diagnosis not present

## 2023-08-14 ENCOUNTER — Ambulatory Visit: Payer: Medicare Other | Attending: Cardiovascular Disease | Admitting: Cardiovascular Disease

## 2023-08-14 ENCOUNTER — Telehealth: Payer: Self-pay | Admitting: Cardiovascular Disease

## 2023-08-14 ENCOUNTER — Encounter: Payer: Self-pay | Admitting: Cardiovascular Disease

## 2023-08-14 VITALS — BP 126/76 | HR 77 | Ht 69.0 in | Wt 191.0 lb

## 2023-08-14 DIAGNOSIS — E782 Mixed hyperlipidemia: Secondary | ICD-10-CM | POA: Diagnosis not present

## 2023-08-14 DIAGNOSIS — I2510 Atherosclerotic heart disease of native coronary artery without angina pectoris: Secondary | ICD-10-CM | POA: Diagnosis not present

## 2023-08-14 DIAGNOSIS — I10 Essential (primary) hypertension: Secondary | ICD-10-CM | POA: Diagnosis not present

## 2023-08-14 NOTE — Assessment & Plan Note (Signed)
History of essential hypertension blood pressure measured today at 126/76.  He is on atenolol, and Hyzaar.

## 2023-08-14 NOTE — Patient Instructions (Signed)
Medication Instructions:  Your physician recommends that you continue on your current medications as directed. Please refer to the Current Medication list given to you today.  *If you need a refill on your cardiac medications before your next appointment, please call your pharmacy*   Lab Work: None  Testing/Procedures: None   Follow-Up: At [facility], you and your health needs are our priority.  As part of our continuing mission to provide you with exceptional heart care, we have created designated Provider Care Teams.  These Care Teams include your primary Cardiologist (physician) and Advanced Practice Providers (APPs -  Physician Assistants and Nurse Practitioners) who all work together to provide you with the care you need, when you need it.  We recommend signing up for the patient portal called "MyChart".  Sign up information is provided on this After Visit Summary.  MyChart is used to connect with patients for Virtual Visits (Telemedicine).  Patients are able to view lab/test results, encounter notes, upcoming appointments, etc.  Non-urgent messages can be sent to your provider as well.   To learn more about what you can do with MyChart, go to [email or web address].    Your next appointment:   12 month(s)  Provider:   Nanetta Batty, MD

## 2023-08-14 NOTE — Assessment & Plan Note (Signed)
History of CAD status post anterior STEMI 04/26/2011 4:00 in the afternoon.  I cathed him revealing an occluded proximal LAD which I stented using a 3.5 x 18 mm long MultiLink vision bare-metal stent.  His EF at that time was 30% with anteroapical akinesia.  Follow-up echo Myoview several months later revealed normal LV systolic function with complete salvage of myocardium.  He did have a card catheterization 04/19/2015 revealing a widely patent stent with no evidence of CAD.  He demonstrated

## 2023-08-14 NOTE — Telephone Encounter (Signed)
Called with recommendations from pharmacy .  LM to call office

## 2023-08-14 NOTE — Progress Notes (Signed)
08/14/2023 Devin Norris   08-31-1946  [id number]  Primary Physician Farris Has, MD Primary Cardiologist: Runell Gess MD FACP, [place], [place], [place]  HPI:  Devin Norris is a 77 y.o.  mildly overweight divorced Caucasian male, father of 2, grandfather to 2 grandchildren, who I last saw in the office 08/09/22.  He worked as an Surveyor, mining and retired TO 2016. He is fairly active and still plays tennis, as well as working out 3 times per week.[name] His risk factors include a strong family history for heart disease. He suffered an anterior STEMI, April 30, 2011, at 4 o'clock in the afternoon, with an occluded proximal LAD, which I stented using a 3.5 x 18 mm long Multi-Link Vision bare-metal stent. His EF was 30% at that time with anteroapical akinesia. Peak CPK MB was 70 with a troponin of 21. His other epicardial arteries were normal. A followup echocardiogram and Myoview performed several months later revealed normal LV systolic function with complete salvage of myocardium and no evidence of scar. He had recurrent chest pain last month and underwent radial crit catheterization on 04/19/15 by Dr. Herbie Baltimore revealing a widely patent stent with no significant CAD and normal LV function. He has had no recurrent symptoms. His lipid profile is followed by his primary care physician.    Since I saw him a year ago he continues to do well.  He denies chest pain or shortness of breath.  He is still actively playing tennis.   Current Meds  Medication Sig   aspirin 81 MG tablet Take 81 mg by mouth daily.   atenolol (TENORMIN) 50 MG tablet Take 50 mg by mouth daily.   clonazePAM (KLONOPIN) 0.5 MG tablet Take 0.5 mg by mouth 2 (two) times daily as needed for anxiety.   fluticasone (FLONASE) 50 MCG/ACT nasal spray Place 2 sprays into the nose daily as needed for rhinitis.   levothyroxine (SYNTHROID, LEVOTHROID) 50 MCG tablet Take 50 mcg by mouth daily before breakfast.    losartan-hydrochlorothiazide (HYZAAR) 100-12.5 MG tablet Take 1 tablet by mouth daily.   Multiple Vitamins-Minerals (ICAPS PO) Take 2 capsules by mouth daily.   nitroGLYCERIN (NITROSTAT) 0.4 MG SL tablet Place 1 tablet (0.4 mg total) under the tongue every 5 (five) minutes as needed for chest pain.   rosuvastatin (CRESTOR) 20 MG tablet Take 20 mg by mouth daily.     Allergies  Allergen Reactions   Contrast Media [Iodinated Contrast Media]     Sob, tightness in throat   Ramipril Cough    Social History   Socioeconomic History   Marital status: Divorced    Spouse name: Not on file   Number of children: 2   Years of education: Not on file   Highest education level: Not on file  Occupational History   Occupation: Surveyor, mining  Tobacco Use   Smoking status: Never   Smokeless tobacco: Never  Substance and Sexual Activity   Alcohol use: Yes    Comment: 1-2 drinks per week   Drug use: No   Sexual activity: Not on file  Other Topics Concern   Not on file  Social History Narrative   Not on file   Social Determinants of Health   Financial Resource Strain: Not on file  Food Insecurity: Not on file  Transportation Needs: Not on file  Physical Activity: Not on file  Stress: Not on file  Social Connections: Not on file  Intimate Partner Violence: Not on file  Review of Systems: General: negative for chills, fever, night sweats or weight changes.  Cardiovascular: negative for chest pain, dyspnea on exertion, edema, orthopnea, palpitations, paroxysmal nocturnal dyspnea or shortness of breath Dermatological: negative for rash Respiratory: negative for cough or wheezing Urologic: negative for hematuria Abdominal: negative for nausea, vomiting, diarrhea, bright red blood per rectum, melena, or hematemesis Neurologic: negative for visual changes, syncope, or dizziness All other systems reviewed and are otherwise negative except as noted above.    Blood pressure 126/76,  pulse 77, height 5\' 9"  (1.753 m), weight 191 lb (86.6 kg), SpO2 98%.  General appearance: alert and no distress Neck: no adenopathy, no carotid bruit, no JVD, supple, symmetrical, trachea midline, and thyroid not enlarged, symmetric, no tenderness/mass/nodules Lungs: clear to auscultation bilaterally Heart: regular rate and rhythm, S1, S2 normal, no murmur, click, rub or gallop Extremities: extremities normal, atraumatic, no cyanosis or edema Pulses: 2+ and symmetric Skin: Skin color, texture, turgor normal. No rashes or lesions Neurologic: Grossly normal  EKG EKG Interpretation Date/Time:  Tuesday August 14 2023 09:26:58 EDT Ventricular Rate:  67 PR Interval:  174 QRS Duration:  88 QT Interval:  366 QTC Calculation: 386 R Axis:   48  Text Interpretation: Normal sinus rhythm Normal ECG When compared with ECG of 03-May-2011 08:47, Criteria for Septal infarct are no longer Present T wave inversion no longer evident in Anterior leads QT has shortened Confirmed by Nanetta Batty [phone]) on 08/14/2023 9:41:42 AM    ASSESSMENT AND PLAN:   Coronary artery disease History of CAD status post anterior STEMI 04/26/2011 4:00 in the afternoon.  I cathed him revealing an occluded proximal LAD which I stented using a 3.5 x 18 mm long MultiLink vision bare-metal stent.  His EF at that time was 30% with anteroapical akinesia.  Follow-up echo Myoview several months later revealed normal LV systolic function with complete salvage of myocardium.  He did have a card catheterization 04/19/2015 revealing a widely patent stent with no evidence of CAD.  He demonstrated  Hyperlipidemia History of hyperlipidemia on rosuvastatin with lipid profile performed 11/25/2022 revealing total cholesterol 130, LDL 67 and HDL 48.  Essential hypertension History of essential hypertension blood pressure measured today at 126/76.  He is on atenolol, and Hyzaar.     Runell Gess MD FACP,FACC,FAHA, [facility] 08/14/2023 9:51  AM

## 2023-08-14 NOTE — Telephone Encounter (Signed)
CoQ10 does not make statin more effective. There is some data that if patients are experiencing muscle pain on statins, that CoQ10 can help reduce side effects so that patients can remain on their statin. If he is tolerating his rosuvastatin, does not need to add CoQ10.

## 2023-08-14 NOTE — Assessment & Plan Note (Signed)
History of hyperlipidemia on rosuvastatin with lipid profile performed 11/25/2022 revealing total cholesterol 130, LDL 67 and HDL 48.

## 2023-08-14 NOTE — Telephone Encounter (Signed)
Patient asking should he take Coq10 along with his statin fore better benefit Please advise

## 2023-08-14 NOTE — Telephone Encounter (Signed)
Pt c/o medication issue:  1. Name of Medication: Rosuvastatin  2. How are you currently taking this medication (dosage and times per day)? 1 time a day  3. Are you having a reaction (difficulty breathing--STAT)?   4. What is your medication issue? Patient wants to know if he needs to start taking Cou-10 along with his Rosuvastatin?

## 2023-08-15 NOTE — Telephone Encounter (Signed)
Recommendations given and patient states understanding

## 2023-09-05 DIAGNOSIS — H40053 Ocular hypertension, bilateral: Secondary | ICD-10-CM | POA: Diagnosis not present

## 2023-09-05 DIAGNOSIS — H25813 Combined forms of age-related cataract, bilateral: Secondary | ICD-10-CM | POA: Diagnosis not present

## 2023-09-05 DIAGNOSIS — H353132 Nonexudative age-related macular degeneration, bilateral, intermediate dry stage: Secondary | ICD-10-CM | POA: Diagnosis not present

## 2023-09-05 DIAGNOSIS — H40023 Open angle with borderline findings, high risk, bilateral: Secondary | ICD-10-CM | POA: Diagnosis not present

## 2023-09-05 DIAGNOSIS — H524 Presbyopia: Secondary | ICD-10-CM | POA: Diagnosis not present

## 2023-09-08 DIAGNOSIS — R051 Acute cough: Secondary | ICD-10-CM | POA: Diagnosis not present

## 2023-09-08 DIAGNOSIS — B349 Viral infection, unspecified: Secondary | ICD-10-CM | POA: Diagnosis not present

## 2023-09-20 ENCOUNTER — Other Ambulatory Visit: Payer: Self-pay | Admitting: Cardiovascular Disease

## 2023-09-20 DIAGNOSIS — I10 Essential (primary) hypertension: Secondary | ICD-10-CM

## 2023-09-20 DIAGNOSIS — I255 Ischemic cardiomyopathy: Secondary | ICD-10-CM

## 2023-12-12 DIAGNOSIS — E785 Hyperlipidemia, unspecified: Secondary | ICD-10-CM | POA: Diagnosis not present

## 2023-12-12 DIAGNOSIS — I10 Essential (primary) hypertension: Secondary | ICD-10-CM | POA: Diagnosis not present

## 2023-12-12 DIAGNOSIS — E039 Hypothyroidism, unspecified: Secondary | ICD-10-CM | POA: Diagnosis not present

## 2023-12-12 DIAGNOSIS — N1831 Chronic kidney disease, stage 3a: Secondary | ICD-10-CM | POA: Diagnosis not present

## 2023-12-12 DIAGNOSIS — R350 Frequency of micturition: Secondary | ICD-10-CM | POA: Diagnosis not present

## 2023-12-12 DIAGNOSIS — Z0000 Encounter for general adult medical examination without abnormal findings: Secondary | ICD-10-CM | POA: Diagnosis not present

## 2023-12-12 DIAGNOSIS — G4700 Insomnia, unspecified: Secondary | ICD-10-CM | POA: Diagnosis not present

## 2023-12-12 DIAGNOSIS — I700 Atherosclerosis of aorta: Secondary | ICD-10-CM | POA: Diagnosis not present

## 2024-05-05 DIAGNOSIS — N1831 Chronic kidney disease, stage 3a: Secondary | ICD-10-CM | POA: Diagnosis not present

## 2024-05-05 DIAGNOSIS — E039 Hypothyroidism, unspecified: Secondary | ICD-10-CM | POA: Diagnosis not present

## 2024-05-05 DIAGNOSIS — I10 Essential (primary) hypertension: Secondary | ICD-10-CM | POA: Diagnosis not present

## 2024-05-29 ENCOUNTER — Other Ambulatory Visit: Payer: Self-pay | Admitting: Cardiovascular Disease

## 2024-05-29 DIAGNOSIS — I255 Ischemic cardiomyopathy: Secondary | ICD-10-CM

## 2024-05-29 DIAGNOSIS — I10 Essential (primary) hypertension: Secondary | ICD-10-CM

## 2024-06-05 DIAGNOSIS — I10 Essential (primary) hypertension: Secondary | ICD-10-CM | POA: Diagnosis not present

## 2024-06-05 DIAGNOSIS — E039 Hypothyroidism, unspecified: Secondary | ICD-10-CM | POA: Diagnosis not present

## 2024-06-05 DIAGNOSIS — N1831 Chronic kidney disease, stage 3a: Secondary | ICD-10-CM | POA: Diagnosis not present

## 2024-06-23 DIAGNOSIS — G4700 Insomnia, unspecified: Secondary | ICD-10-CM | POA: Diagnosis not present

## 2024-06-23 DIAGNOSIS — R350 Frequency of micturition: Secondary | ICD-10-CM | POA: Diagnosis not present

## 2024-06-23 DIAGNOSIS — E039 Hypothyroidism, unspecified: Secondary | ICD-10-CM | POA: Diagnosis not present

## 2024-06-23 DIAGNOSIS — I10 Essential (primary) hypertension: Secondary | ICD-10-CM | POA: Diagnosis not present

## 2024-06-23 DIAGNOSIS — E785 Hyperlipidemia, unspecified: Secondary | ICD-10-CM | POA: Diagnosis not present

## 2024-06-23 DIAGNOSIS — N1831 Chronic kidney disease, stage 3a: Secondary | ICD-10-CM | POA: Diagnosis not present

## 2024-07-06 DIAGNOSIS — E039 Hypothyroidism, unspecified: Secondary | ICD-10-CM | POA: Diagnosis not present

## 2024-07-06 DIAGNOSIS — I10 Essential (primary) hypertension: Secondary | ICD-10-CM | POA: Diagnosis not present

## 2024-07-06 DIAGNOSIS — N1831 Chronic kidney disease, stage 3a: Secondary | ICD-10-CM | POA: Diagnosis not present

## 2024-08-05 DIAGNOSIS — E039 Hypothyroidism, unspecified: Secondary | ICD-10-CM | POA: Diagnosis not present

## 2024-08-05 DIAGNOSIS — N1831 Chronic kidney disease, stage 3a: Secondary | ICD-10-CM | POA: Diagnosis not present

## 2024-08-05 DIAGNOSIS — I10 Essential (primary) hypertension: Secondary | ICD-10-CM | POA: Diagnosis not present

## 2024-08-31 ENCOUNTER — Other Ambulatory Visit: Payer: Self-pay | Admitting: Cardiovascular Disease

## 2024-08-31 DIAGNOSIS — I255 Ischemic cardiomyopathy: Secondary | ICD-10-CM

## 2024-08-31 DIAGNOSIS — I10 Essential (primary) hypertension: Secondary | ICD-10-CM

## 2024-10-08 ENCOUNTER — Ambulatory Visit: Attending: Cardiovascular Disease | Admitting: Cardiovascular Disease

## 2024-10-08 ENCOUNTER — Encounter: Payer: Self-pay | Admitting: Cardiovascular Disease

## 2024-10-08 VITALS — BP 112/60 | HR 65 | Ht 69.0 in | Wt 182.0 lb

## 2024-10-08 DIAGNOSIS — I255 Ischemic cardiomyopathy: Secondary | ICD-10-CM | POA: Diagnosis not present

## 2024-10-08 DIAGNOSIS — I2510 Atherosclerotic heart disease of native coronary artery without angina pectoris: Secondary | ICD-10-CM | POA: Diagnosis not present

## 2024-10-08 DIAGNOSIS — I10 Essential (primary) hypertension: Secondary | ICD-10-CM | POA: Diagnosis not present

## 2024-10-08 DIAGNOSIS — E782 Mixed hyperlipidemia: Secondary | ICD-10-CM

## 2024-10-08 NOTE — Assessment & Plan Note (Signed)
 History of essential hypertension blood pressure measured today 112/60.  He is on atenolol , losartan  and hydrochlorothiazide .

## 2024-10-08 NOTE — Assessment & Plan Note (Addendum)
 History of CAD status post anterior STEMI 04/26/2011 at 4:00 in the afternoon.  He had an occluded proximal LAD which was stented with a 3.5 mm x 18 mm long MultiLink vision bare-metal stent.  His EF at the time was 30% with anteroapical akinesia.  Ultimately his LV function normalized and Myoview was normal as well.  He had repeat catheterization by Dr. Anner 04/19/2015 revealing a patent stent with normal LV function.  He remains on baby aspirin .  He denies chest pain or shortness of breath.

## 2024-10-08 NOTE — Assessment & Plan Note (Signed)
 History of hyperlipidemia on statin therapy with lipid profile performed 12/12/2023 revealing total cholesterol 121, LDL 64 and HDL 41.

## 2024-10-08 NOTE — Progress Notes (Signed)
 10/08/2024 Devin Norris   [birth year]  [id number]  Primary Physician Kip Righter, MD Primary Cardiologist: Dorn [name] MD FACP, [place], [place], FSCAI  HPI:  Devin Norris is a 78 y.o.  mildly overweight divorced Caucasian male, father of 2, grandfather to 2 grandchildren, who I last saw in the office 08/14/2023.  He worked as an surveyor, mining and retired TO 2016. He is fairly active and still plays tennis, as well as working out 3 times per week.SABRA His risk factors include a strong family history for heart disease. He suffered an anterior STEMI, April 30, 2011, at 4 o'clock in the afternoon, with an occluded proximal LAD, which I stented using a 3.5 x 18 mm long Multi-Link Vision bare-metal stent. His EF was 30% at that time with anteroapical akinesia. Peak CPK MB was 70 with a troponin of 21. His other epicardial arteries were normal. A followup echocardiogram and Myoview performed several months later revealed normal LV systolic function with complete salvage of myocardium and no evidence of scar. He had recurrent chest pain last month and underwent radial crit catheterization on 04/19/15 by Dr. Anner revealing a widely patent stent with no significant CAD and normal LV function. He has had no recurrent symptoms. His lipid profile is followed by his primary care physician.    Since I saw him a year ago he continues to do well.  He went hiking in the [place] and [place] with his children this past summer and was otherwise asymptomatic.   He is still actively playing tennis.   Current Meds  Medication Sig   aspirin  81 MG tablet Take 81 mg by mouth daily.   atenolol  (TENORMIN ) 50 MG tablet Take 50 mg by mouth daily.   clonazePAM  (KLONOPIN ) 0.5 MG tablet Take 0.5 mg by mouth 2 (two) times daily as needed for anxiety.   fluticasone  (FLONASE ) 50 MCG/ACT nasal spray Place 2 sprays into the nose daily as needed for rhinitis.   levothyroxine  (SYNTHROID , LEVOTHROID) 50 MCG tablet Take  50 mcg by mouth daily before breakfast.   losartan -hydrochlorothiazide  (HYZAAR) 100-12.5 MG tablet TAKE 1 TABLET BY MOUTH DAILY   Multiple Vitamins-Minerals (ICAPS PO) Take 2 capsules by mouth daily.   nitroGLYCERIN  (NITROSTAT ) 0.4 MG SL tablet Place 1 tablet (0.4 mg total) under the tongue every 5 (five) minutes as needed for chest pain.   rosuvastatin  (CRESTOR ) 20 MG tablet Take 20 mg by mouth daily.     Allergies  Allergen Reactions   Contrast Media [Iodinated Contrast Media]     Sob, tightness in throat   Ramipril Cough    Social History   Socioeconomic History   Marital status: Divorced    Spouse name: Not on file   Number of children: 2   Years of education: Not on file   Highest education level: Not on file  Occupational History   Occupation: surveyor, mining  Tobacco Use   Smoking status: Never   Smokeless tobacco: Never  Substance and Sexual Activity   Alcohol use: Yes    Comment: 1-2 drinks per week   Drug use: No   Sexual activity: Not on file  Other Topics Concern   Not on file  Social History Narrative   Not on file   Social Drivers of Health   Financial Resource Strain: Not on file  Food Insecurity: Not on file  Transportation Needs: Not on file  Physical Activity: Not on file  Stress: Not on file  Social  Connections: Not on file  Intimate Partner Violence: Not on file     Review of Systems: General: negative for chills, fever, night sweats or weight changes.  Cardiovascular: negative for chest pain, dyspnea on exertion, edema, orthopnea, palpitations, paroxysmal nocturnal dyspnea or shortness of breath Dermatological: negative for rash Respiratory: negative for cough or wheezing Urologic: negative for hematuria Abdominal: negative for nausea, vomiting, diarrhea, bright red blood per rectum, melena, or hematemesis Neurologic: negative for visual changes, syncope, or dizziness All other systems reviewed and are otherwise negative except as noted  above.    Blood pressure 112/60, pulse 65, height 5' 9 (1.753 m), weight 182 lb (82.6 kg).  General appearance: alert and no distress Neck: no adenopathy, no carotid bruit, no JVD, supple, symmetrical, trachea midline, and thyroid  not enlarged, symmetric, no tenderness/mass/nodules Lungs: clear to auscultation bilaterally Heart: regular rate and rhythm, S1, S2 normal, no murmur, click, rub or gallop Extremities: extremities normal, atraumatic, no cyanosis or edema Pulses: 2+ and symmetric Skin: Skin color, texture, turgor normal. No rashes or lesions Neurologic: Grossly normal  EKG EKG Interpretation Date/Time:  Wednesday October 08 2024 11:00:13 EST Ventricular Rate:  65 PR Interval:  164 QRS Duration:  84 QT Interval:  374 QTC Calculation: 388 R Axis:   32  Text Interpretation: Normal sinus rhythm Normal ECG When compared with ECG of 14-Aug-2023 09:26, No significant change was found Confirmed by Court Carrier [phone]) on 10/08/2024 11:03:10 AM    ASSESSMENT AND PLAN:   Coronary artery disease History of CAD status post anterior STEMI 04/26/2011 at 4:00 in the afternoon.  He had an occluded proximal LAD which was stented with a 3.5 mm x 18 mm long MultiLink vision bare-metal stent.  His EF at the time was 30% with anteroapical akinesia.  Ultimately his LV function normalized and Myoview was normal as well.  He had repeat catheterization by Dr. Anner 04/19/2015 revealing a patent stent with normal LV function.  He remains on baby aspirin .  He denies chest pain or shortness of breath.  Hyperlipidemia History of hyperlipidemia on statin therapy with lipid profile performed 12/12/2023 revealing total cholesterol 121, LDL 64 and HDL 41.  Essential hypertension History of essential hypertension blood pressure measured today 112/60.  He is on atenolol , losartan  and hydrochlorothiazide .     Carrier DOROTHA Court MD [facility], [facility] 10/08/2024 11:10 AM

## 2024-10-08 NOTE — Patient Instructions (Signed)

## 2024-10-13 ENCOUNTER — Other Ambulatory Visit: Payer: Self-pay | Admitting: Cardiovascular Disease

## 2024-10-13 DIAGNOSIS — I255 Ischemic cardiomyopathy: Secondary | ICD-10-CM

## 2024-10-13 DIAGNOSIS — I10 Essential (primary) hypertension: Secondary | ICD-10-CM
